# Patient Record
Sex: Male | Born: 1940 | Race: White | Hispanic: No | Marital: Married | Smoking: Former smoker
Health system: Southern US, Community
[De-identification: ages and names within clinical notes are randomized; demographics above are authoritative.]

## PROBLEM LIST (undated history)

## (undated) DIAGNOSIS — J449 Chronic obstructive pulmonary disease, unspecified: Secondary | ICD-10-CM

## (undated) DIAGNOSIS — I10 Essential (primary) hypertension: Secondary | ICD-10-CM

---

## 2011-08-29 ENCOUNTER — Ambulatory Visit: Payer: Medicare Other | Attending: Family Medicine

## 2011-08-29 DIAGNOSIS — G4730 Sleep apnea, unspecified: Secondary | ICD-10-CM

## 2011-08-29 DIAGNOSIS — G4710 Hypersomnia, unspecified: Secondary | ICD-10-CM | POA: Insufficient documentation

## 2011-08-29 DIAGNOSIS — Z6834 Body mass index (BMI) 34.0-34.9, adult: Secondary | ICD-10-CM | POA: Insufficient documentation

## 2011-08-31 NOTE — Procedures (Signed)
[facility] Omir Cooprider A. Gerilyn Pilgrim, MD     [email or web address]          NAMEREISE, GLADNEY                 ACCOUNT NO.:  [id number]  MEDICAL RECORD NO.:  [id number]          PATIENT TYPE:  OUT  LOCATION:  SLEEP LAB                     FACILITY:  [facility]  PHYSICIAN:  Marshawn Ninneman A. Gerilyn Pilgrim, M.D. DATE OF BIRTH:  02/18/1940  DATE OF STUDY:  08/29/2011                           NOCTURNAL POLYSOMNOGRAM  REFERRING PHYSICIAN:  Ernestine Conrad, MD  INDICATION FOR STUDY:  71 year old, who presents with fatigue, hypersomnia, and snoring.  This has been done to evaluate for obstructive sleep apnea syndrome.  EPWORTH SLEEPINESS SCORE:1.   BMI 34.  MEDICATIONS:  Amlodipine.  SLEEP ARCHITECTURE:  Total recording time is 421 minute.  Sleep efficiency is quite low at 27%, sleep latency 43 minutes.  REM latency 188 minutes.  Stage N1 18%, N2 of 60% N3 10%, and REM sleep 12%.  RESPIRATORY DATA:  Baseline oxygen saturation is 93 lowest saturation 86 during REM sleep.  Diagnostic AHI is 42 and RDI 6.  CARDIAC DATA:  Average heart rate is 67 with no significant dysrhythmias observed.  MOVEMENT-PARASOMNIA:  PLM index quite high at 94 minutes.  Additionally, there is periodic limb movements seen during the awake time before the patient went to bed suggestive of restless legs syndrome.  IMPRESSIONS-RECOMMENDATIONS: 1. 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 2. Abnormal architecture with very poor sleep efficiency.  Recommendation: Suggest dopamine agonist such as Requip or Mirapex to treat the limb movement.  Thanks for this referral.     Virjean Boman A. Gerilyn Pilgrim, M.D.    KAD/MEDQ  D:  08/31/2011 09:13:06  T:  08/31/2011 10:00:59  Job:  [id number]

## 2020-04-08 ENCOUNTER — Other Ambulatory Visit: Payer: Self-pay

## 2020-04-08 ENCOUNTER — Inpatient Hospital Stay (HOSPITAL_COMMUNITY)
Admission: EM | Admit: 2020-04-08 | Discharge: 2020-04-10 | DRG: 389 | Disposition: A | Discharge status: Home / Self Care | Payer: No Typology Code available for payment source | Attending: Family Medicine | Admitting: Family Medicine

## 2020-04-08 ENCOUNTER — Encounter (HOSPITAL_COMMUNITY): Payer: Self-pay | Admitting: Emergency Medicine

## 2020-04-08 ENCOUNTER — Emergency Department (HOSPITAL_COMMUNITY): Payer: No Typology Code available for payment source

## 2020-04-08 DIAGNOSIS — R739 Hyperglycemia, unspecified: Secondary | ICD-10-CM | POA: Diagnosis present

## 2020-04-08 DIAGNOSIS — Z20822 Contact with and (suspected) exposure to covid-19: Secondary | ICD-10-CM | POA: Diagnosis present

## 2020-04-08 DIAGNOSIS — Z87891 Personal history of nicotine dependence: Secondary | ICD-10-CM

## 2020-04-08 DIAGNOSIS — Z7982 Long term (current) use of aspirin: Secondary | ICD-10-CM

## 2020-04-08 DIAGNOSIS — J449 Chronic obstructive pulmonary disease, unspecified: Secondary | ICD-10-CM

## 2020-04-08 DIAGNOSIS — K56609 Unspecified intestinal obstruction, unspecified as to partial versus complete obstruction: Principal | ICD-10-CM | POA: Diagnosis present

## 2020-04-08 DIAGNOSIS — E785 Hyperlipidemia, unspecified: Secondary | ICD-10-CM

## 2020-04-08 DIAGNOSIS — R140 Abdominal distension (gaseous): Secondary | ICD-10-CM

## 2020-04-08 DIAGNOSIS — Z79899 Other long term (current) drug therapy: Secondary | ICD-10-CM

## 2020-04-08 DIAGNOSIS — K219 Gastro-esophageal reflux disease without esophagitis: Secondary | ICD-10-CM

## 2020-04-08 DIAGNOSIS — R109 Unspecified abdominal pain: Secondary | ICD-10-CM

## 2020-04-08 DIAGNOSIS — N179 Acute kidney failure, unspecified: Secondary | ICD-10-CM | POA: Diagnosis present

## 2020-04-08 DIAGNOSIS — Z7951 Long term (current) use of inhaled steroids: Secondary | ICD-10-CM

## 2020-04-08 DIAGNOSIS — R112 Nausea with vomiting, unspecified: Secondary | ICD-10-CM

## 2020-04-08 DIAGNOSIS — I10 Essential (primary) hypertension: Secondary | ICD-10-CM | POA: Diagnosis present

## 2020-04-08 DIAGNOSIS — K567 Ileus, unspecified: Secondary | ICD-10-CM | POA: Diagnosis present

## 2020-04-08 DIAGNOSIS — R1084 Generalized abdominal pain: Secondary | ICD-10-CM

## 2020-04-08 DIAGNOSIS — Z6830 Body mass index (BMI) 30.0-30.9, adult: Secondary | ICD-10-CM

## 2020-04-08 DIAGNOSIS — Z888 Allergy status to other drugs, medicaments and biological substances status: Secondary | ICD-10-CM

## 2020-04-08 DIAGNOSIS — E669 Obesity, unspecified: Secondary | ICD-10-CM | POA: Diagnosis present

## 2020-04-08 DIAGNOSIS — E876 Hypokalemia: Secondary | ICD-10-CM | POA: Diagnosis present

## 2020-04-08 HISTORY — DX: Essential (primary) hypertension: I10

## 2020-04-08 HISTORY — DX: Chronic obstructive pulmonary disease, unspecified: J44.9

## 2020-04-08 LAB — COMPREHENSIVE METABOLIC PANEL
ALT: 42 U/L (ref 0–44)
AST: 27 U/L (ref 15–41)
Albumin: 3.9 g/dL (ref 3.5–5.0)
Alkaline Phosphatase: 51 U/L (ref 38–126)
Anion gap: 14 (ref 5–15)
BUN: 26 mg/dL — ABNORMAL HIGH (ref 8–23)
CO2: 26 mmol/L (ref 22–32)
Calcium: 9.4 mg/dL (ref 8.9–10.3)
Chloride: 94 mmol/L — ABNORMAL LOW (ref 98–111)
Creatinine, Ser: 1.58 mg/dL — ABNORMAL HIGH (ref 0.61–1.24)
GFR, Estimated: 45 mL/min — ABNORMAL LOW (ref >60)
Glucose, Bld: 153 mg/dL — ABNORMAL HIGH (ref 70–99)
Potassium: 3.1 mmol/L — ABNORMAL LOW (ref 3.5–5.1)
Sodium: 134 mmol/L — ABNORMAL LOW (ref 135–145)
Total Bilirubin: 1.1 mg/dL (ref 0.3–1.2)
Total Protein: 7.4 g/dL (ref 6.5–8.1)

## 2020-04-08 LAB — POC OCCULT BLOOD, ED: Fecal Occult Bld: NEGATIVE

## 2020-04-08 LAB — CBC
HCT: 47.1 % (ref 39.0–52.0)
Hemoglobin: 16.6 g/dL (ref 13.0–17.0)
MCH: 32 pg (ref 26.0–34.0)
MCHC: 35.2 g/dL (ref 30.0–36.0)
MCV: 90.9 fL (ref 80.0–100.0)
Platelets: 325 10*3/uL (ref 150–400)
RBC: 5.18 MIL/uL (ref 4.22–5.81)
RDW: 12.9 % (ref 11.5–15.5)
WBC: 9.9 10*3/uL (ref 4.0–10.5)
nRBC: 0 % (ref 0.0–0.2)

## 2020-04-08 LAB — LIPASE, BLOOD: Lipase: 21 U/L (ref 11–51)

## 2020-04-08 MED ORDER — ONDANSETRON HCL 4 MG/2ML IJ SOLN
4.0000 mg | Freq: Once | INTRAMUSCULAR | Status: AC
  Administered 2020-04-08: 4 mg via INTRAVENOUS
  Filled 2020-04-08: qty 2

## 2020-04-08 MED ORDER — SODIUM CHLORIDE 0.9 % IV SOLN: INTRAVENOUS | Status: DC

## 2020-04-08 MED ORDER — IOHEXOL 300 MG/ML  SOLN
100.0000 mL | Freq: Once | INTRAMUSCULAR | Status: AC | PRN
  Administered 2020-04-09: 100 mL via INTRAVENOUS

## 2020-04-08 MED ORDER — SODIUM CHLORIDE 0.9 % IV BOLUS
1000.0000 mL | Freq: Once | INTRAVENOUS | Status: AC
  Administered 2020-04-08: 1000 mL via INTRAVENOUS

## 2020-04-08 NOTE — ED Provider Notes (Addendum)
[facility] EMERGENCY DEPARTMENT Provider Note   CSN: [id number] Arrival date & time: 04/08/20  1856     History Chief Complaint  Patient presents with  . Emesis    Melvin [name] is a 80 y.o. male.  Patient with a complaint of nausea vomiting and diarrhea for a week abdominal distention generalized abdominal pain. States that his bowel movements have been black in color. Patient was seen at urgent care and given something for a stomach bug. It has not helped. Temp here 98.1. Tachycardic at 120 respirations 18 blood pressure 120/67 oxygen saturations 92% on room air. Patient is followed at the [facility]. Known to have COPD        Past Medical History:  Diagnosis Date  . COPD (chronic obstructive pulmonary disease) (HCC)   . Hypertension     There are no problems to display for this patient.   History reviewed. No pertinent surgical history.     No family history on file.     Home Medications Prior to Admission medications   Medication Sig Start Date End Date Taking? Authorizing Provider  albuterol (VENTOLIN HFA) 108 (90 Base) MCG/ACT inhaler Inhale 2 puffs into the lungs every 4 (four) hours as needed for wheezing or shortness of breath.   Yes [provider]  AMLODIPINE BESYLATE PO Take 10 mg by mouth daily.   Yes [provider]  aspirin EC 81 MG tablet Take 81 mg by mouth daily. Swallow whole.   Yes [provider]  budesonide-formoterol (SYMBICORT) 160-4.5 MCG/ACT inhaler Inhale 2 puffs into the lungs 2 (two) times daily.   Yes [provider]  cetirizine (ZYRTEC) 10 MG tablet Take 10 mg by mouth daily.   Yes [provider]  hydrochlorothiazide (HYDRODIURIL) 25 MG tablet Take 25 mg by mouth daily.   Yes [provider]  losartan (COZAAR) 100 MG tablet Take 100 mg by mouth daily.   Yes [provider]  Menthol-Methyl Salicylate (THERA-GESIC) 0.5-15 % CREA Apply 1 application topically in the morning  and at bedtime. Avoid face,groin,wash hands after use   Yes [provider]  Multiple Vitamin (MULTIVITAMIN) tablet Take 1 tablet by mouth daily.   Yes [provider]  omeprazole (PRILOSEC) 20 MG capsule Take 20 mg by mouth daily.   Yes [provider]  pramipexole (MIRAPEX) 0.125 MG tablet Take 0.125 mg by mouth at bedtime.   Yes [provider]  simvastatin (ZOCOR) 40 MG tablet Take 40 mg by mouth daily.   Yes [provider]  tiotropium (SPIRIVA) 18 MCG inhalation capsule Place 18 mcg into inhaler and inhale daily.   Yes [provider]  hyoscyamine (LEVSIN) 0.125 MG tablet Take by mouth every 6 (six) hours as needed. Patient not taking: No sig reported 04/07/20   [provider]    Allergies    Atorvastatin and Lisinopril  Review of Systems   Review of Systems  Constitutional: Negative for chills and fever.  HENT: Negative for congestion, rhinorrhea and sore throat.   Eyes: Negative for visual disturbance.  Respiratory: Negative for cough and shortness of breath.   Cardiovascular: Negative for chest pain and leg swelling.  Gastrointestinal: Positive for abdominal distention and abdominal pain. Negative for diarrhea, nausea and vomiting.  Genitourinary: Negative for dysuria.  Musculoskeletal: Negative for back pain and neck pain.  Skin: Negative for rash.  Neurological: Negative for dizziness, light-headedness and headaches.  Hematological: Does not bruise/bleed easily.  Psychiatric/Behavioral: Negative for confusion.  Physical Exam Updated Vital Signs BP (!) 165/81   Pulse 100   Temp 98.1 F (36.7 C)   Resp 18   Ht 1.753 m (5\' 9" )   Wt 104.3 kg   SpO2 100%   BMI 33.97 kg/m   Physical Exam Vitals and nursing note reviewed.  Constitutional:      General: He is not in acute distress.    Appearance: He is well-developed and well-nourished. He is ill-appearing.  HENT:     Head: Normocephalic and  atraumatic.  Eyes:     Conjunctiva/sclera: Conjunctivae normal.  Cardiovascular:     Rate and Rhythm: Normal rate and regular rhythm.     Heart sounds: No murmur heard.   Pulmonary:     Effort: Pulmonary effort is normal. No respiratory distress.     Breath sounds: Normal breath sounds. No wheezing.  Abdominal:     General: There is distension.     Palpations: Abdomen is soft.     Tenderness: There is abdominal tenderness.     Comments: Abdomen is distended. Some mild diffuse tenderness.  Genitourinary:    Rectum: Guaiac result negative.     Comments: Stool a light brown in color. Hemoccult negative Musculoskeletal:        General: No swelling or edema.     Cervical back: Neck supple.  Skin:    General: Skin is warm and dry.  Neurological:     General: No focal deficit present.     Mental Status: He is alert and oriented to person, place, and time.     Cranial Nerves: No cranial nerve deficit.     Sensory: No sensory deficit.  Psychiatric:        Mood and Affect: Mood and affect normal.     ED Results / Procedures / Treatments   Labs (all labs ordered are listed, but only abnormal results are displayed) Labs Reviewed  COMPREHENSIVE METABOLIC PANEL - Abnormal; Notable for the following components:      Result Value   Sodium 134 (*)    Potassium 3.1 (*)    Chloride 94 (*)    Glucose, Bld 153 (*)    BUN 26 (*)    Creatinine, Ser 1.58 (*)    GFR, Estimated 45 (*)    All other components within normal limits  LIPASE, BLOOD  CBC  URINALYSIS, ROUTINE W REFLEX MICROSCOPIC  POC OCCULT BLOOD, ED    EKG None  Radiology No results found.  Procedures Procedures   CRITICAL CARE Performed by: Total critical care time: 35 minutes Critical care time was exclusive of separately billable procedures and treating other patients. Critical care was necessary to treat or prevent imminent or life-threatening deterioration. Critical care was time spent  personally by me on the following activities: development of treatment plan with patient and/or surrogate as well as nursing, discussions with consultants, evaluation of patient's response to treatment, examination of patient, obtaining history from patient or surrogate, ordering and performing treatments and interventions, ordering and review of laboratory studies, ordering and review of radiographic studies, pulse oximetry and re-evaluation of patient's condition.   Medications Ordered in ED Medications  0.9 %  sodium chloride infusion (has no administration in time range)  iohexol (OMNIPAQUE) 300 MG/ML solution 100 mL (has no administration in time range)  sodium chloride 0.9 % bolus 1,000 mL (1,000 mLs Intravenous New Bag/Given 04/08/20 2223)  ondansetron (ZOFRAN) injection 4 mg (4 mg Intravenous Given 04/08/20 2223)    ED  Course  I have reviewed the triage vital signs and the nursing notes.  Pertinent labs & imaging results that were available during my care of the patient were reviewed by me and considered in my medical decision making (see chart for details).    MDM Rules/Calculators/A&P                         Patient with significant abdominal distention. Some generalized tenderness without guarding. Also about a 1 week history of nausea vomiting and diarrhea. Patient already had melanotic stool. But no evidence of that here today Hemoccult negative and light brown in color. Patient will get CT scan of abdomen and pelvis. Patient's oxygen saturation when he came in was 92%. But he will drop down into the upper 80s. Started on 2 L nasal cannula. We will go ahead and add on chest x-ray. Patient may very well require admission for the hypoxia. We do not have much of a past medical history on him he is followed at the [place] but he is known to have COPD. No wheezing here.  In addition although it would be unusual. With all the nausea vomiting and diarrhea gas COVID is a possibility. But had not  order Covid testing until we get CT scan and chest x-ray results back.  Final Clinical Impression(s) / ED Diagnoses Final diagnoses:  Nausea vomiting and diarrhea  Generalized abdominal pain    Rx / DC Orders ED Discharge Orders    None       Vanetta Mulders, MD 04/09/20 0000    Vanetta Mulders, MD 04/09/20 0025

## 2020-04-08 NOTE — ED Triage Notes (Signed)
Pt c/o N/V/D x 1 week. Pt was seen at [facility] who diagnosed him with a stomach bug. Pt states medications he was given have not helped.

## 2020-04-08 NOTE — ED Notes (Signed)
Pt 87% on room air. Placed on 2l [place]

## 2020-04-09 ENCOUNTER — Inpatient Hospital Stay (HOSPITAL_COMMUNITY): Payer: No Typology Code available for payment source

## 2020-04-09 ENCOUNTER — Emergency Department (HOSPITAL_COMMUNITY): Payer: No Typology Code available for payment source

## 2020-04-09 ENCOUNTER — Encounter (HOSPITAL_COMMUNITY): Payer: Self-pay | Admitting: Internal Medicine

## 2020-04-09 DIAGNOSIS — E669 Obesity, unspecified: Secondary | ICD-10-CM

## 2020-04-09 DIAGNOSIS — Z20822 Contact with and (suspected) exposure to covid-19: Secondary | ICD-10-CM | POA: Diagnosis present

## 2020-04-09 DIAGNOSIS — R140 Abdominal distension (gaseous): Secondary | ICD-10-CM

## 2020-04-09 DIAGNOSIS — E876 Hypokalemia: Secondary | ICD-10-CM

## 2020-04-09 DIAGNOSIS — K56609 Unspecified intestinal obstruction, unspecified as to partial versus complete obstruction: Secondary | ICD-10-CM | POA: Diagnosis present

## 2020-04-09 DIAGNOSIS — E785 Hyperlipidemia, unspecified: Secondary | ICD-10-CM

## 2020-04-09 DIAGNOSIS — Z79899 Other long term (current) drug therapy: Secondary | ICD-10-CM | POA: Diagnosis not present

## 2020-04-09 DIAGNOSIS — R112 Nausea with vomiting, unspecified: Secondary | ICD-10-CM | POA: Diagnosis not present

## 2020-04-09 DIAGNOSIS — Z6830 Body mass index (BMI) 30.0-30.9, adult: Secondary | ICD-10-CM | POA: Diagnosis not present

## 2020-04-09 DIAGNOSIS — R197 Diarrhea, unspecified: Secondary | ICD-10-CM | POA: Diagnosis not present

## 2020-04-09 DIAGNOSIS — E782 Mixed hyperlipidemia: Secondary | ICD-10-CM

## 2020-04-09 DIAGNOSIS — J449 Chronic obstructive pulmonary disease, unspecified: Secondary | ICD-10-CM | POA: Diagnosis present

## 2020-04-09 DIAGNOSIS — Z7951 Long term (current) use of inhaled steroids: Secondary | ICD-10-CM | POA: Diagnosis not present

## 2020-04-09 DIAGNOSIS — K219 Gastro-esophageal reflux disease without esophagitis: Secondary | ICD-10-CM

## 2020-04-09 DIAGNOSIS — R1084 Generalized abdominal pain: Secondary | ICD-10-CM

## 2020-04-09 DIAGNOSIS — R739 Hyperglycemia, unspecified: Secondary | ICD-10-CM | POA: Diagnosis present

## 2020-04-09 DIAGNOSIS — Z87891 Personal history of nicotine dependence: Secondary | ICD-10-CM | POA: Diagnosis not present

## 2020-04-09 DIAGNOSIS — N179 Acute kidney failure, unspecified: Secondary | ICD-10-CM

## 2020-04-09 DIAGNOSIS — I10 Essential (primary) hypertension: Secondary | ICD-10-CM

## 2020-04-09 DIAGNOSIS — Z7982 Long term (current) use of aspirin: Secondary | ICD-10-CM | POA: Diagnosis not present

## 2020-04-09 DIAGNOSIS — R109 Unspecified abdominal pain: Secondary | ICD-10-CM

## 2020-04-09 DIAGNOSIS — Z888 Allergy status to other drugs, medicaments and biological substances status: Secondary | ICD-10-CM | POA: Diagnosis not present

## 2020-04-09 DIAGNOSIS — K567 Ileus, unspecified: Secondary | ICD-10-CM | POA: Diagnosis present

## 2020-04-09 LAB — URINALYSIS, ROUTINE W REFLEX MICROSCOPIC
Bacteria, UA: NONE SEEN
Bilirubin Urine: NEGATIVE
Glucose, UA: NEGATIVE mg/dL
Hgb urine dipstick: NEGATIVE
Ketones, ur: 5 mg/dL — AB
Leukocytes,Ua: NEGATIVE
Nitrite: NEGATIVE
Protein, ur: 30 mg/dL — AB
Specific Gravity, Urine: 1.021 (ref 1.005–1.030)
pH: 5 (ref 5.0–8.0)

## 2020-04-09 LAB — CBC
HCT: 42.9 % (ref 39.0–52.0)
Hemoglobin: 14.9 g/dL (ref 13.0–17.0)
MCH: 32.3 pg (ref 26.0–34.0)
MCHC: 34.7 g/dL (ref 30.0–36.0)
MCV: 92.9 fL (ref 80.0–100.0)
Platelets: 258 10*3/uL (ref 150–400)
RBC: 4.62 MIL/uL (ref 4.22–5.81)
RDW: 13 % (ref 11.5–15.5)
WBC: 7.7 10*3/uL (ref 4.0–10.5)
nRBC: 0 % (ref 0.0–0.2)

## 2020-04-09 LAB — RESP PANEL BY RT-PCR (FLU A&B, COVID) ARPGX2
Influenza A by PCR: NEGATIVE
Influenza B by PCR: NEGATIVE
SARS Coronavirus 2 by RT PCR: NEGATIVE

## 2020-04-09 LAB — COMPREHENSIVE METABOLIC PANEL
ALT: 35 U/L (ref 0–44)
AST: 21 U/L (ref 15–41)
Albumin: 3.4 g/dL — ABNORMAL LOW (ref 3.5–5.0)
Alkaline Phosphatase: 43 U/L (ref 38–126)
Anion gap: 12 (ref 5–15)
BUN: 26 mg/dL — ABNORMAL HIGH (ref 8–23)
CO2: 28 mmol/L (ref 22–32)
Calcium: 8.5 mg/dL — ABNORMAL LOW (ref 8.9–10.3)
Chloride: 96 mmol/L — ABNORMAL LOW (ref 98–111)
Creatinine, Ser: 1.04 mg/dL (ref 0.61–1.24)
GFR, Estimated: >60 mL/min (ref >60)
Glucose, Bld: 122 mg/dL — ABNORMAL HIGH (ref 70–99)
Potassium: 3.3 mmol/L — ABNORMAL LOW (ref 3.5–5.1)
Sodium: 136 mmol/L (ref 135–145)
Total Bilirubin: 0.8 mg/dL (ref 0.3–1.2)
Total Protein: 6.3 g/dL — ABNORMAL LOW (ref 6.5–8.1)

## 2020-04-09 LAB — PROTIME-INR
INR: 1.2 (ref 0.8–1.2)
Prothrombin Time: 14.7 seconds (ref 11.4–15.2)

## 2020-04-09 LAB — PHOSPHORUS: Phosphorus: 3.6 mg/dL (ref 2.5–4.6)

## 2020-04-09 LAB — APTT: aPTT: 31 seconds (ref 24–36)

## 2020-04-09 LAB — MAGNESIUM: Magnesium: 1.8 mg/dL (ref 1.7–2.4)

## 2020-04-09 MED ORDER — ENOXAPARIN SODIUM 60 MG/0.6ML ~~LOC~~ SOLN
50.0000 mg | SUBCUTANEOUS | Status: DC
  Administered 2020-04-09 – 2020-04-10 (×2): 50 mg via SUBCUTANEOUS
  Filled 2020-04-09 (×2): qty 0.6

## 2020-04-09 MED ORDER — SODIUM CHLORIDE 0.9 % IV SOLN: INTRAVENOUS | Status: DC

## 2020-04-09 MED ORDER — PHENOL 1.4 % MT LIQD
1.0000 | OROMUCOSAL | Status: DC | PRN
  Administered 2020-04-09: 1 via OROMUCOSAL
  Filled 2020-04-09: qty 177

## 2020-04-09 MED ORDER — GABAPENTIN 300 MG PO CAPS
600.0000 mg | ORAL_CAPSULE | Freq: Once | ORAL | Status: AC
  Administered 2020-04-09: 600 mg via ORAL
  Filled 2020-04-09: qty 2

## 2020-04-09 MED ORDER — ONDANSETRON HCL 4 MG/2ML IJ SOLN
4.0000 mg | Freq: Once | INTRAMUSCULAR | Status: AC
  Administered 2020-04-09: 4 mg via INTRAVENOUS
  Filled 2020-04-09: qty 2

## 2020-04-09 MED ORDER — PANTOPRAZOLE SODIUM 40 MG IV SOLR
40.0000 mg | INTRAVENOUS | Status: DC
  Administered 2020-04-09 – 2020-04-10 (×2): 40 mg via INTRAVENOUS
  Filled 2020-04-09 (×2): qty 40

## 2020-04-09 MED ORDER — MOMETASONE FURO-FORMOTEROL FUM 200-5 MCG/ACT IN AERO
2.0000 | INHALATION_SPRAY | Freq: Two times a day (BID) | RESPIRATORY_TRACT | Status: DC
  Administered 2020-04-09 – 2020-04-10 (×3): 2 via RESPIRATORY_TRACT
  Filled 2020-04-09: qty 8.8

## 2020-04-09 MED ORDER — MORPHINE SULFATE (PF) 2 MG/ML IV SOLN: 2.0000 mg | INTRAVENOUS | Status: DC | PRN

## 2020-04-09 MED ORDER — POTASSIUM CHLORIDE IN NACL 20-0.9 MEQ/L-% IV SOLN: INTRAVENOUS | Status: DC

## 2020-04-09 MED ORDER — DOCUSATE SODIUM 50 MG/5ML PO LIQD
50.0000 mg | Freq: Every day | ORAL | Status: DC
  Administered 2020-04-09: 50 mg
  Filled 2020-04-09 (×3): qty 10

## 2020-04-09 MED ORDER — UMECLIDINIUM BROMIDE 62.5 MCG/INH IN AEPB
1.0000 | INHALATION_SPRAY | Freq: Every day | RESPIRATORY_TRACT | Status: DC
  Administered 2020-04-09 – 2020-04-10 (×2): 1 via RESPIRATORY_TRACT
  Filled 2020-04-09: qty 7

## 2020-04-09 MED ORDER — DOCUSATE SODIUM 50 MG/5ML PO LIQD
50.0000 mg | Freq: Every day | ORAL | Status: DC
  Filled 2020-04-09 (×3): qty 10

## 2020-04-09 MED ORDER — FENTANYL CITRATE (PF) 100 MCG/2ML IJ SOLN
50.0000 ug | Freq: Once | INTRAMUSCULAR | Status: AC
  Administered 2020-04-09: 50 ug via INTRAVENOUS
  Filled 2020-04-09: qty 2

## 2020-04-09 MED ORDER — LABETALOL HCL 5 MG/ML IV SOLN: 5.0000 mg | Freq: Four times a day (QID) | INTRAVENOUS | Status: DC | PRN

## 2020-04-09 MED ORDER — TIOTROPIUM BROMIDE MONOHYDRATE 18 MCG IN CAPS
18.0000 ug | ORAL_CAPSULE | Freq: Every day | RESPIRATORY_TRACT | Status: DC
  Filled 2020-04-09: qty 5

## 2020-04-09 MED ORDER — POTASSIUM CHLORIDE 10 MEQ/100ML IV SOLN
10.0000 meq | INTRAVENOUS | Status: AC
  Administered 2020-04-09 (×2): 10 meq via INTRAVENOUS
  Filled 2020-04-09: qty 100

## 2020-04-09 MED ORDER — ALBUTEROL SULFATE HFA 108 (90 BASE) MCG/ACT IN AERS: 2.0000 | INHALATION_SPRAY | RESPIRATORY_TRACT | Status: DC | PRN

## 2020-04-09 MED ORDER — ONDANSETRON HCL 4 MG/2ML IJ SOLN: 4.0000 mg | Freq: Four times a day (QID) | INTRAMUSCULAR | Status: DC | PRN

## 2020-04-09 NOTE — ED Provider Notes (Signed)
Care assumed from Dr. Deretha Emory.  Patient with abdominal distention, abdominal pain, nausea and vomiting of black stools.  Hemoccult is negative.  CT scan is concerning for small bowel obstruction.  Patient with no previous abdominal surgeries.  Will hydrate, place NG tube, plan admission. CXR is negative. Lungs clear. Abdomen distended, soft. No guarding or rebound.  Case discussed with Dr. Thomes Dinning.  Surgical consult in the morning. Message sent to Dr. Henreitta Leber.   Glynn Octave, MD 04/09/20 [phone]

## 2020-04-09 NOTE — Consult Note (Signed)
[facility] Consult  Reason for Consult: ? SBO  Referring Physician:  Dr. Bronson Ing  Chief Complaint    Emesis      HPI: Melvin [name] is a 80 y.o. male with HTN, COPD who has never had surgery and comes in with 1 week history of abdominal pain, bloating, nausea, and diarrhea. His diarrhea was so bad last week that he started to take imodium on Thursday. He then became constipated. He has been eating and drinking but he says that he would get bloated. He has been passing flatus today and had a BM this Am. He says that he is feeling better. He has never had a bowel obstruction before. He has had colonoscopies in the past with polyps.    He said he went to urgent care an was told he had a GI but before his symptoms worsened with the imodium, and then came to the ED.   Past Medical History:  Diagnosis Date  . COPD (chronic obstructive pulmonary disease) (HCC)   . Hypertension     History reviewed. No pertinent surgical history.  History reviewed. No pertinent family history.  Social History   Tobacco Use  . Smoking status: Former Games developer  . Smokeless tobacco: Never Used  Vaping Use  . Vaping Use: Never used    Medications:  I have reviewed the patient's current medications. Prior to Admission:  Medications Prior to Admission  Medication Sig Dispense Refill Last Dose  . albuterol (VENTOLIN HFA) 108 (90 Base) MCG/ACT inhaler Inhale 2 puffs into the lungs every 4 (four) hours as needed for wheezing or shortness of breath.   04/07/2020 at Unknown time  . AMLODIPINE BESYLATE PO Take 10 mg by mouth daily.   04/08/2020 at Unknown time  . aspirin EC 81 MG tablet Take 81 mg by mouth daily. Swallow whole.   04/08/2020 at Unknown time  . budesonide-formoterol (SYMBICORT) 160-4.5 MCG/ACT inhaler Inhale 2 puffs into the lungs 2 (two) times daily.   04/08/2020 at Unknown time  . cetirizine (ZYRTEC) 10 MG tablet Take 10 mg by mouth daily.   04/08/2020 at Unknown time  .  hydrochlorothiazide (HYDRODIURIL) 25 MG tablet Take 25 mg by mouth daily.   04/08/2020 at Unknown time  . losartan (COZAAR) 100 MG tablet Take 100 mg by mouth daily.   04/08/2020 at Unknown time  . Menthol-Methyl Salicylate (THERA-GESIC) 0.5-15 % CREA Apply 1 application topically in the morning and at bedtime. Avoid face,groin,wash hands after use   04/07/2020  . Multiple Vitamin (MULTIVITAMIN) tablet Take 1 tablet by mouth daily.   04/08/2020 at Unknown time  . omeprazole (PRILOSEC) 20 MG capsule Take 20 mg by mouth daily.   04/08/2020 at Unknown time  . pramipexole (MIRAPEX) 0.125 MG tablet Take 0.125 mg by mouth at bedtime.   04/07/2020 at Unknown time  . simvastatin (ZOCOR) 40 MG tablet Take 40 mg by mouth daily.   04/07/2020 at Unknown time  . tiotropium (SPIRIVA) 18 MCG inhalation capsule Place 18 mcg into inhaler and inhale daily.   04/08/2020 at Unknown time  . hyoscyamine (LEVSIN) 0.125 MG tablet Take by mouth every 6 (six) hours as needed. (Patient not taking: No sig reported)   Not Taking at Unknown time   Scheduled: . docusate  50 mg Per Tube Daily  . enoxaparin (LOVENOX) injection  50 mg Subcutaneous Q24H  . mometasone-formoterol  2 puff Inhalation BID  . pantoprazole (PROTONIX) IV  40 mg Intravenous Q24H  . umeclidinium bromide  1 puff Inhalation Daily   Continuous: . 0.9 % NaCl with KCl 20 mEq / L 100 mL/hr at 04/09/20 0851   FOY:DXAJOINOM, labetalol, morphine injection, ondansetron (ZOFRAN) IV, phenol  Allergies  Allergen Reactions  . Atorvastatin   . Lisinopril Cough     ROS:  A comprehensive review of systems was negative except for: Gastrointestinal: positive for abdominal pain, diarrhea and bloating  Blood pressure 134/69, pulse 85, temperature 98.4 F (36.9 C), resp. rate 20, height 5\' 9"  (1.753 m), weight 104.3 kg, SpO2 94 %. Physical Exam Vitals reviewed.  HENT:     Head: Normocephalic.  Eyes:     Extraocular Movements: Extraocular movements intact.  Cardiovascular:      Rate and Rhythm: Normal rate and regular rhythm.  Pulmonary:     Effort: Pulmonary effort is normal.  Abdominal:     General: There is distension.     Palpations: Abdomen is soft.     Tenderness: There is no abdominal tenderness.  Musculoskeletal:        General: Normal range of motion.     Cervical back: No rigidity.  Skin:    General: Skin is warm.  Neurological:     General: No focal deficit present.     Mental Status: He is alert and oriented to person, place, and time.  Psychiatric:        Mood and Affect: Mood normal.        Behavior: Behavior normal.        Thought Content: Thought content normal.        Judgment: Judgment normal.     Results: Results for orders placed or performed during the hospital encounter of 04/08/20 (from the past 48 hour(s))  Lipase, blood     Status: None   Collection Time: 04/08/20  8:03 PM  Result Value Ref Range   Lipase 21 11 - 51 U/L    Comment: Performed at [facility], [address], [place], [place]  Comprehensive metabolic panel     Status: Abnormal   Collection Time: 04/08/20  8:03 PM  Result Value Ref Range   Sodium 134 (L) 135 - 145 mmol/L   Potassium 3.1 (L) 3.5 - 5.1 mmol/L   Chloride 94 (L) 98 - 111 mmol/L   CO2 26 22 - 32 mmol/L   Glucose, Bld 153 (H) 70 - 99 mg/dL    Comment: Glucose reference range applies only to samples taken after fasting for at least 8 hours.   BUN 26 (H) 8 - 23 mg/dL   Creatinine, Ser 06/08/20 (H) 0.61 - 1.24 mg/dL   Calcium 9.4 8.9 - 9.47 mg/dL   Total Protein 7.4 6.5 - 8.1 g/dL   Albumin 3.9 3.5 - 5.0 g/dL   AST 27 15 - 41 U/L   ALT 42 0 - 44 U/L   Alkaline Phosphatase 51 38 - 126 U/L   Total Bilirubin 1.1 0.3 - 1.2 mg/dL   GFR, Estimated 45 (L) >60 mL/min    Comment: (NOTE) Calculated using the CKD-EPI Creatinine Equation (2021)    Anion gap 14 5 - 15    Comment: Performed at [facility], [address], [place], [place]  CBC     Status: None   Collection Time:  04/08/20  8:03 PM  Result Value Ref Range   WBC 9.9 4.0 - 10.5 K/uL   RBC 5.18 4.22 - 5.81 MIL/uL   Hemoglobin 16.6 13.0 - 17.0 g/dL   HCT 47.1  39.0 - 52.0 %   MCV 90.9 80.0 - 100.0 fL   MCH 32.0 26.0 - 34.0 pg   MCHC 35.2 30.0 - 36.0 g/dL   RDW 76.8 11.5 - 72.6 %   Platelets 325 150 - 400 K/uL   nRBC 0.0 0.0 - 0.2 %    Comment: Performed at [facility], [address], [place], [place] [address]  Urinalysis, Routine w reflex microscopic     Status: Abnormal   Collection Time: 04/08/20 11:32 PM  Result Value Ref Range   Color, Urine AMBER (A) YELLOW    Comment: BIOCHEMICALS MAY BE AFFECTED BY COLOR   APPearance CLOUDY (A) CLEAR   Specific Gravity, Urine 1.021 1.005 - 1.030   pH 5.0 5.0 - 8.0   Glucose, UA NEGATIVE NEGATIVE mg/dL   Hgb urine dipstick NEGATIVE NEGATIVE   Bilirubin Urine NEGATIVE NEGATIVE   Ketones, ur 5 (A) NEGATIVE mg/dL   Protein, ur 30 (A) NEGATIVE mg/dL   Nitrite NEGATIVE NEGATIVE   Leukocytes,Ua NEGATIVE NEGATIVE   RBC / HPF 0-5 0 - 5 RBC/hpf   WBC, UA 6-10 0 - 5 WBC/hpf   Bacteria, UA NONE SEEN NONE SEEN   Squamous Epithelial / LPF 0-5 0 - 5   Mucus PRESENT    Hyaline Casts, UA PRESENT     Comment: Performed at [facility], [address], [place], [place] [address]  POC occult blood, ED Provider will collect     Status: None   Collection Time: 04/08/20 11:33 PM  Result Value Ref Range   Fecal Occult Bld NEGATIVE NEGATIVE  Resp Panel by RT-PCR (Flu A&B, Covid) Nasopharyngeal Swab     Status: None   Collection Time: 04/09/20  1:52 AM   Specimen: Nasopharyngeal Swab; Nasopharyngeal(NP) swabs in vial transport medium  Result Value Ref Range   SARS Coronavirus 2 by RT PCR NEGATIVE NEGATIVE    Comment: (NOTE) SARS-CoV-2 target nucleic acids are NOT DETECTED.  The SARS-CoV-2 RNA is generally detectable in upper respiratory specimens during the acute phase of infection. The lowest concentration of SARS-CoV-2 viral copies this assay can detect is 138  copies/mL. A negative result does not preclude SARS-Cov-2 infection and should not be used as the sole basis for treatment or other patient management decisions. A negative result may occur with  improper specimen collection/handling, submission of specimen other than nasopharyngeal swab, presence of viral mutation(s) within the areas targeted by this assay, and inadequate number of viral copies(<138 copies/mL). A negative result must be combined with clinical observations, patient history, and epidemiological information. The expected result is Negative.  Fact Sheet for Patients:  BloggerCourse.com  Fact Sheet for Healthcare Providers:  SeriousBroker.it  This test is no t yet approved or cleared by the [place] FDA and  has been authorized for detection and/or diagnosis of SARS-CoV-2 by FDA under an Emergency Use Authorization (EUA). This EUA will remain  in effect (meaning this test can be used) for the duration of the COVID-19 declaration under Section 564(b)(1) of the Act, 21 U.S.C.section 360bbb-3(b)(1), unless the authorization is terminated  or revoked sooner.       Influenza A by PCR NEGATIVE NEGATIVE   Influenza B by PCR NEGATIVE NEGATIVE    Comment: (NOTE) The Xpert Xpress SARS-CoV-2/FLU/RSV plus assay is intended as an aid in the diagnosis of influenza from Nasopharyngeal swab specimens and should not be used as a sole basis for treatment. Nasal washings and aspirates are unacceptable for Xpert Xpress SARS-CoV-2/FLU/RSV testing.  Fact Sheet for Patients: BloggerCourse.comhttps://www.fda.gov/media/152166/download  Fact Sheet for Healthcare Providers: SeriousBroker.ithttps://www.fda.gov/media/152162/download  This test is not yet approved or cleared by the [place] FDA and has been authorized for detection and/or diagnosis of SARS-CoV-2 by FDA under an Emergency Use Authorization (EUA). This EUA will remain in effect (meaning this test  can be used) for the duration of the COVID-19 declaration under Section 564(b)(1) of the Act, 21 U.S.C. section 360bbb-3(b)(1), unless the authorization is terminated or revoked.  Performed at [facility], [address] [place], [place] [address]   Comprehensive metabolic panel     Status: Abnormal   Collection Time: 04/09/20  5:59 AM  Result Value Ref Range   Sodium 136 135 - 145 mmol/L   Potassium 3.3 (L) 3.5 - 5.1 mmol/L   Chloride 96 (L) 98 - 111 mmol/L   CO2 28 22 - 32 mmol/L   Glucose, Bld 122 (H) 70 - 99 mg/dL    Comment: Glucose reference range applies only to samples taken after fasting for at least 8 hours.   BUN 26 (H) 8 - 23 mg/dL   Creatinine, Ser 0.981.04 0.61 - 1.24 mg/dL   Calcium 8.5 (L) 8.9 - 10.3 mg/dL   Total Protein 6.3 (L) 6.5 - 8.1 g/dL   Albumin 3.4 (L) 3.5 - 5.0 g/dL   AST 21 15 - 41 U/L   ALT 35 0 - 44 U/L   Alkaline Phosphatase 43 38 - 126 U/L   Total Bilirubin 0.8 0.3 - 1.2 mg/dL   GFR, Estimated >11>60 >91>60 mL/min    Comment: (NOTE) Calculated using the CKD-EPI Creatinine Equation (2021)    Anion gap 12 5 - 15    Comment: Performed at [facility], [address] [place], [place] [address]  CBC     Status: None   Collection Time: 04/09/20  5:59 AM  Result Value Ref Range   WBC 7.7 4.0 - 10.5 K/uL   RBC 4.62 4.22 - 5.81 MIL/uL   Hemoglobin 14.9 13.0 - 17.0 g/dL   HCT 56.242.9 13.039.0 - 86.552.0 %   MCV 92.9 80.0 - 100.0 fL   MCH 32.3 26.0 - 34.0 pg   MCHC 34.7 30.0 - 36.0 g/dL   RDW 78.413.0 69.611.5 - 29.515.5 %   Platelets 258 150 - 400 K/uL   nRBC 0.0 0.0 - 0.2 %    Comment: Performed at [facility], [address] [place], [place] [address]  Protime-INR     Status: None   Collection Time: 04/09/20  5:59 AM  Result Value Ref Range   Prothrombin Time 14.7 11.4 - 15.2 seconds   INR 1.2 0.8 - 1.2    Comment: (NOTE) INR goal varies based on device and disease states. Performed at [facility], [address] [place], [place] [address]   APTT     Status: None    Collection Time: 04/09/20  5:59 AM  Result Value Ref Range   aPTT 31 24 - 36 seconds    Comment: Performed at [facility], [address] [place], [place] [address]  Magnesium     Status: None   Collection Time: 04/09/20  5:59 AM  Result Value Ref Range   Magnesium 1.8 1.7 - 2.4 mg/dL    Comment: Performed at [facility], [address] [place], [place] [address]  Phosphorus     Status: None   Collection Time: 04/09/20  5:59 AM  Result Value Ref Range   Phosphorus 3.6 2.5 - 4.6 mg/dL  Comment: Performed at [facility] [address], [place], [place] [address]   Reviewed CT personally and with Dr. Tyron Russell- dilated small bowel and thickened versus decompressed ileum distally, but no signs of an overt transition CT Abdomen Pelvis W Contrast  Result Date: 04/09/2020 CLINICAL DATA:  Abdominal distension and pain EXAM: CT ABDOMEN AND PELVIS WITH CONTRAST TECHNIQUE: Multidetector CT imaging of the abdomen and pelvis was performed using the standard protocol following bolus administration of intravenous contrast. CONTRAST:  OMNIPAQUE IOHEXOL 300 MG/ML  SOLN COMPARISON:  None. FINDINGS: Lower chest: The visualized heart size within normal limits. No pericardial fluid/thickening. No hiatal hernia. The visualized portions of the lungs are clear. Hepatobiliary: The liver is normal in density without focal abnormality.The main portal vein is patent. Layering calcified gallstone is present. Pancreas: Fatty atrophy seen throughout the pancreas. No pancreatic ductal dilatation or surrounding inflammatory changes. Spleen: Normal in size without focal abnormality. Adrenals/Urinary Tract: Both adrenal glands appear normal. The kidneys and collecting system appear normal without evidence of urinary tract calculus or hydronephrosis. Bladder is unremarkable. Stomach/Bowel: The stomach and proximal small bowel is unremarkable. There is dilated loops of distal jejunum and ileum measuring up to 4.3 cm with air and  fluid down to the level of the distal ileum. Within the right lower quadrant there is a focal area of wall thickening and narrowing within the distal ileal loops, series 5, image 56. The remainder of the distal ileum and terminal ileum are decompressed. There is scattered colonic diverticula without diverticulitis. The appendix is unremarkable. Vascular/Lymphatic: There are no enlarged mesenteric, retroperitoneal, or pelvic lymph nodes. Scattered aortic atherosclerotic calcifications are seen without aneurysmal dilatation. Reproductive: The prostate is unremarkable. Other: Fat containing inguinal hernias are noted. Musculoskeletal: No acute or significant osseous findings. Degenerative changes seen throughout the visualized thoracolumbar spine. IMPRESSION: Findings consistent with a small-bowel obstruction to the distal ileum where there is a focal area of narrowing in the right lower quadrant. The terminal ileum appears to be decompressed. No free air or loculated fluid collections. Diverticulosis without diverticulitis Cholelithiasis Aortic Atherosclerosis (ICD10-I70.0). Electronically Signed   By: Jonna Clark M.D.   On: 04/09/2020 00:31   DG Chest Portable 1 View  Result Date: 04/09/2020 CLINICAL DATA:  NG tube placement EXAM: PORTABLE CHEST 1 VIEW COMPARISON:  April 09, 2020 FINDINGS: The enteric tube tip projects over the expected region of the gastric body. Dilated loops of bowel are noted in the upper abdomen. There are old healed left-sided rib fractures. IMPRESSION: NG tube projects over the gastric body. Electronically Signed   By: Katherine Mantle M.D.   On: 04/09/2020 02:39   DG Chest Port 1 View  Result Date: 04/09/2020 CLINICAL DATA:  Hypoxia EXAM: PORTABLE CHEST 1 VIEW COMPARISON:  None. FINDINGS: The heart size and mediastinal contours are within normal limits. Aortic knob calcifications are seen. Both lungs are clear. The visualized skeletal structures are unremarkable. IMPRESSION: No active  disease. Electronically Signed   By: Jonna Clark M.D.   On: 04/09/2020 00:40     Assessment & Plan:  Irineo Gaulin is a 80 y.o. male with what is likely a gastroenteritis that was made worse after taking imodium and becoming constipated. He is not clinically obstructed but does have signs of some degree of obstruction on CT. There is no signs of a transition but more of a gradual decrease in caliber and possible thickened ileum versus just a decompressed segment.  He has no history of IBD.    -  Discussed that he is feeling better and not obstructed clinically, NG removed and sips/ chips today - Ambulate - Given the question of the thickening of the ileum at some point possibly as an outpatient it will be a good idea to do CT enterography to ensure we are not missing a mass or something concerning and to demonstrate that this area of thickening has resolved   Updated Dr. Flossie Dibble and RN.   All questions were answered to the satisfaction of the patient.    Lucretia Roers 04/09/2020, 9:19 AM

## 2020-04-09 NOTE — TOC Progression Note (Signed)
[facility] notification completed. Reference number is [id number] [phone]

## 2020-04-09 NOTE — H&P (Signed)
History and Physical  Melvin [name] CLE:[id number] DOB: 1940-12-30 DOA: 04/08/2020  Referring physician: Glynn Octave, MD PCP: Pcp, No  Patient coming from: Home  Chief Complaint: Abdominal pain and vomiting   HPI: Melvin [name] is a 80 y.o. male with medical history significant for hypertension, COPD who presents to the emergency department due to 1 week onset of nausea, vomiting, diarrhea, abdominal pain and distention.  Patient complained of about 2 episodes of nonbloody, nonbilious vomiting daily, he has not been able to tolerate oral intake due to vomiting.  He complained of increased abdominal distention, diarrhea was noted to be black in nature, Imodium and Pepto-Bismol controlled the diarrhea, patient did not complain of constipation, so he tried over the counter medication without improvement.  Abdominal pain was generalized and intermittent in nature with no alleviating/aggravating factor.  He went to an urgent care and was asked to stop taking the laxatives, patient states that he was prescribed medication for stomach bug which did not help, so he presented to the ED for further evaluation and management.  Patient denies fever, chills, chest pain, headache and palpitations.  ED Course:  In the emergency department, he was hemodynamically stable.  Work-up in the ED showed normal CBC, hypokalemia, BUN to creatinine 26/1.58 (no prior labs for comparison).  CBG 153, urinalysis was unimpressive for UTI. CT abdomen and pelvis with contrast showed findings consistent with a small bowel obstruction to the distal ileum with a focal area of narrowing in the right lower quadrant.  Chest x-ray showed no active disease.  NG-tube was placed.  IV Zofran and IV fentanyl were given.  Patient was started on IV hydration.  Hospitalist was asked to admit patient for further evaluation and management.  Review of Systems: Constitutional: Negative for chills and fever.  HENT: Negative for ear pain and sore  throat.   Eyes: Negative for pain and visual disturbance.  Respiratory: Negative for cough, chest tightness and shortness of breath.   Cardiovascular: Negative for chest pain and palpitations.  Gastrointestinal: Positive for abdominal pain, diarrhea, nausea and vomiting.  Endocrine: Negative for polyphagia and polyuria.  Genitourinary: Negative for decreased urine volume, dysuria, enuresis, hematuria Musculoskeletal: Negative for arthralgias and back pain.  Skin: Negative for color change and rash.  Allergic/Immunologic: Negative for immunocompromised state.  Neurological: Negative for tremors, syncope, speech difficulty, weakness, light-headedness and headaches.  Hematological: Does not bruise/bleed easily.  All other systems reviewed and are negative  Past Medical History:  Diagnosis Date  . COPD (chronic obstructive pulmonary disease) (HCC)   . Hypertension    History reviewed. No pertinent surgical history.  Social History:  reports that he has quit smoking. He has never used smokeless tobacco. No history on file for alcohol use and drug use.   Allergies  Allergen Reactions  . Atorvastatin   . Lisinopril Cough    History reviewed. No pertinent family history.   Prior to Admission medications   Medication Sig Start Date End Date Taking? Authorizing Provider  albuterol (VENTOLIN HFA) 108 (90 Base) MCG/ACT inhaler Inhale 2 puffs into the lungs every 4 (four) hours as needed for wheezing or shortness of breath.   Yes [provider]  AMLODIPINE BESYLATE PO Take 10 mg by mouth daily.   Yes [provider]  aspirin EC 81 MG tablet Take 81 mg by mouth daily. Swallow whole.   Yes [provider]  budesonide-formoterol (SYMBICORT) 160-4.5 MCG/ACT inhaler Inhale 2 puffs into the lungs 2 (two) times daily.  Yes [provider]  cetirizine (ZYRTEC) 10 MG tablet Take 10 mg by mouth daily.   Yes [provider]  hydrochlorothiazide  (HYDRODIURIL) 25 MG tablet Take 25 mg by mouth daily.   Yes [provider]  losartan (COZAAR) 100 MG tablet Take 100 mg by mouth daily.   Yes [provider]  Menthol-Methyl Salicylate (THERA-GESIC) 0.5-15 % CREA Apply 1 application topically in the morning and at bedtime. Avoid face,groin,wash hands after use   Yes [provider]  Multiple Vitamin (MULTIVITAMIN) tablet Take 1 tablet by mouth daily.   Yes [provider]  omeprazole (PRILOSEC) 20 MG capsule Take 20 mg by mouth daily.   Yes [provider]  pramipexole (MIRAPEX) 0.125 MG tablet Take 0.125 mg by mouth at bedtime.   Yes [provider]  simvastatin (ZOCOR) 40 MG tablet Take 40 mg by mouth daily.   Yes [provider]  tiotropium (SPIRIVA) 18 MCG inhalation capsule Place 18 mcg into inhaler and inhale daily.   Yes [provider]  hyoscyamine (LEVSIN) 0.125 MG tablet Take by mouth every 6 (six) hours as needed. Patient not taking: No sig reported 04/07/20   [provider]    Physical Exam: BP 137/89 (BP Location: Left Arm)   Pulse 92   Temp 98.8 F (37.1 C) (Oral)   Resp 19   Ht 5\' 9"  (1.753 m)   Wt 104.3 kg   SpO2 93%   BMI 33.97 kg/m   . General: 80 y.o. year-old male well developed well nourished in no acute distress.  Alert and oriented x3. Marland Kitchen. HEENT: NCAT, EOMI . Neck: Supple, trachea medial . Cardiovascular: Regular rate and rhythm with no rubs or gallops.  No thyromegaly or JVD noted.  No lower extremity edema. 2/4 pulses in all 4 extremities. Marland Kitchen. Respiratory: Clear to auscultation with no wheezes or rales. Good inspiratory effort. . Abdomen: Soft, tender to palpation, distended with decreased bowel sounds in upper quadrants . Muskuloskeletal: No cyanosis, clubbing or edema noted bilaterally . Neuro: CN II-XII intact, strength 5/5 x 4, sensation, reflexes intact . Skin: No ulcerative lesions noted or rashes . Psychiatry: Judgement and  insight appear normal. Mood is appropriate for condition and setting          Labs on Admission:  Basic Metabolic Panel: Recent Labs  Lab 04/08/20 2003  NA 134*  K 3.1*  CL 94*  CO2 26  GLUCOSE 153*  BUN 26*  CREATININE 1.58*  CALCIUM 9.4   Liver Function Tests: Recent Labs  Lab 04/08/20 2003  AST 27  ALT 42  ALKPHOS 51  BILITOT 1.1  PROT 7.4  ALBUMIN 3.9   Recent Labs  Lab 04/08/20 2003  LIPASE 21   No results for input(s): AMMONIA in the last 168 hours. CBC: Recent Labs  Lab 04/08/20 2003  WBC 9.9  HGB 16.6  HCT 47.1  MCV 90.9  PLT 325   Cardiac Enzymes: No results for input(s): CKTOTAL, CKMB, CKMBINDEX, TROPONINI in the last 168 hours.  BNP (last 3 results) No results for input(s): BNP in the last 8760 hours.  ProBNP (last 3 results) No results for input(s): PROBNP in the last 8760 hours.  CBG: No results for input(s): GLUCAP in the last 168 hours.  Radiological Exams on Admission: CT Abdomen Pelvis W Contrast  Result Date: 04/09/2020 CLINICAL DATA:  Abdominal distension and pain EXAM: CT ABDOMEN AND PELVIS WITH CONTRAST TECHNIQUE: Multidetector CT imaging of the abdomen and pelvis  was performed using the standard protocol following bolus administration of intravenous contrast. CONTRAST:  OMNIPAQUE IOHEXOL 300 MG/ML  SOLN COMPARISON:  None. FINDINGS: Lower chest: The visualized heart size within normal limits. No pericardial fluid/thickening. No hiatal hernia. The visualized portions of the lungs are clear. Hepatobiliary: The liver is normal in density without focal abnormality.The main portal vein is patent. Layering calcified gallstone is present. Pancreas: Fatty atrophy seen throughout the pancreas. No pancreatic ductal dilatation or surrounding inflammatory changes. Spleen: Normal in size without focal abnormality. Adrenals/Urinary Tract: Both adrenal glands appear normal. The kidneys and collecting system appear normal without evidence of  urinary tract calculus or hydronephrosis. Bladder is unremarkable. Stomach/Bowel: The stomach and proximal small bowel is unremarkable. There is dilated loops of distal jejunum and ileum measuring up to 4.3 cm with air and fluid down to the level of the distal ileum. Within the right lower quadrant there is a focal area of wall thickening and narrowing within the distal ileal loops, series 5, image 56. The remainder of the distal ileum and terminal ileum are decompressed. There is scattered colonic diverticula without diverticulitis. The appendix is unremarkable. Vascular/Lymphatic: There are no enlarged mesenteric, retroperitoneal, or pelvic lymph nodes. Scattered aortic atherosclerotic calcifications are seen without aneurysmal dilatation. Reproductive: The prostate is unremarkable. Other: Fat containing inguinal hernias are noted. Musculoskeletal: No acute or significant osseous findings. Degenerative changes seen throughout the visualized thoracolumbar spine. IMPRESSION: Findings consistent with a small-bowel obstruction to the distal ileum where there is a focal area of narrowing in the right lower quadrant. The terminal ileum appears to be decompressed. No free air or loculated fluid collections. Diverticulosis without diverticulitis Cholelithiasis Aortic Atherosclerosis (ICD10-I70.0). Electronically Signed   By: Jonna Clark M.D.   On: 04/09/2020 00:31   DG Chest Portable 1 View  Result Date: 04/09/2020 CLINICAL DATA:  NG tube placement EXAM: PORTABLE CHEST 1 VIEW COMPARISON:  April 09, 2020 FINDINGS: The enteric tube tip projects over the expected region of the gastric body. Dilated loops of bowel are noted in the upper abdomen. There are old healed left-sided rib fractures. IMPRESSION: NG tube projects over the gastric body. Electronically Signed   By: Katherine Mantle M.D.   On: 04/09/2020 02:39   DG Chest Port 1 View  Result Date: 04/09/2020 CLINICAL DATA:  Hypoxia EXAM: PORTABLE CHEST 1 VIEW  COMPARISON:  None. FINDINGS: The heart size and mediastinal contours are within normal limits. Aortic knob calcifications are seen. Both lungs are clear. The visualized skeletal structures are unremarkable. IMPRESSION: No active disease. Electronically Signed   By: Jonna Clark M.D.   On: 04/09/2020 00:40    EKG: I independently viewed the EKG done and my findings are as followed: EKG was not done in the ED  Assessment/Plan Present on Admission: . Small bowel obstruction (HCC)  Principal Problem:   Small bowel obstruction (HCC) Active Problems:   Nausea vomiting and diarrhea   Abdominal pain   Abdominal distention   Hypokalemia   AKI (acute kidney injury) (HCC)   Essential hypertension   COPD (chronic obstructive pulmonary disease) (HCC)   Obesity (BMI 30.0-34.9)   Nausea, vomiting, abdominal pain/bloating possibly secondary to small bowel obstruction  CT abdomen and pelvis with contrast showed findings consistent with a small bowel obstruction to the distal ileum with a focal area of narrowing in the right lower quadrant. Continue NG tube  Continue NPO at this time with plan to advance diet as tolerated Continue IV hydration Continue IV morphine  2 mg q.4h p.r.n. for moderate/severe pain Continue Zofran p.r.n. for nausea/vomiting General surgery will be consulted and we shall await their recommendation   Acute diarrhea Patient has not had diarrhea since arrival to the ED C. difficile pending  Hypokalemia K+ 3.1; this will be replenished  Acute kidney injury BUN to creatinine 26/1.58 (no prior labs for comparison) Continue IV hydration Renally adjust medications, avoid nephrotoxic agents/dehydration/hypotension  Hyperglycemia possibly reactive CBG 153, continue to monitor blood glucose level with morning labs  Essential hypertension (moderately controlled) Patient is currently n.p.o. Continue IV labetalol 5 mg every 6 hours for SBP > 180  COPD (not in acute  exacerbation) Continue Ventolin, Dulera and Spiriva  Obesity (BMI 33.97) Patient was counseled on diet and lifestyle modification  Hyperlipidemia Continue home med when patient resumes oral intake  GERD Continue IV Protonix  DVT prophylaxis: SCDs  Code Status: Full code  Family Communication: Son at bedside (all questions answered to satisfaction)  Disposition Plan:  Patient is from:                        home Anticipated DC to:                   SNF or family members home Anticipated DC date:               2-3 days Anticipated DC barriers:           Inpatient management required due to small bowel obstruction.  Surgical consult pending  Consults called: General surgery  Admission status: Inpatient    Frankey Shown MD [facility]  04/09/2020, 4:00 AM

## 2020-04-09 NOTE — Progress Notes (Signed)
PROGRESS NOTE    Patient: Melvin Melvin [name]                            PCP: Pcp, No                    DOB: 1941-01-07            DOA: 04/08/2020 ZOX:[id number]             DOS: 04/09/2020, 1:23 PM   LOS: 0 days   Date of Service: The patient was seen and examined on 04/09/2020  Subjective:   The patient was seen and examined this morning. Stable at this time. Still complaining of :  Otherwise no issues overnight .  Brief Narrative:   Melvin Melvin [name] is a 80 y.o. male with medical history significant for hypertension, COPD who presents to the emergency department due to 1 week onset of nausea, vomiting, diarrhea, abdominal pain and distention.   Work-up revealed small bowel obstruction to distal ileus with a focal area of narrowing in the right lower quadrant based on a CT scan findings  ED Course:  Hemodynamically stable, labs reviewed, noted for hypokalemia, AKI, NG-tube was placed.  IV Zofran and IV fentanyl were given.  Patient was started on IV hydration.   Assessment & Plan:   Principal Problem:   Small bowel obstruction (HCC) Active Problems:   Nausea vomiting and diarrhea   Abdominal pain   Abdominal distention   Hypokalemia   AKI (acute kidney injury) (HCC)   Essential hypertension   COPD (chronic obstructive pulmonary disease) (HCC)   Obesity (BMI 30.0-34.9)   Hyperglycemia   Hyperlipidemia   GERD (gastroesophageal reflux disease)   Small bowel obstruction -Associated with nausea vomiting abdominal pain, distention-improving -NG tube in place to wall suction.. -Reporting a bowel movement last night, gas this morning CT abdomen and pelvis with contrast showed findings consistent with a small bowel obstruction to the distal ileum with a focal area of narrowing in the right lower quadrant. -We will clamp the NG tube, keep the patient n.p.o. with exception of ice chips -General surgery consulted, following closely -appreciate follow-up and recommendations -Continue IV  hydration  Continue IV morphine 2 mg q.4h p.r.n. for moderate/severe pain Continue Zofran p.r.n. for nausea/vomiting   Acute diarrhea -Improved C. difficile negative  Hypokalemia K+ 3.1; this will be replenished -IV fluid with potassium supplements  Acute kidney injury Melvin [name] to creatinine 26/1.58 (no prior labs for comparison) >> creatinine 1.04 today -Improving with hydration Renally adjust medications, avoid nephrotoxic agents/dehydration/hypotension  Hyperglycemia possibly reactive CBG 153,... 122 this a.m. -Monitoring closely  Essential hypertension (moderately controlled) Patient is currently n.p.o. Continue IV labetalol 5 mg every 6 hours for SBP > 180 -Blood pressure has stabilized 134/69 this a.m.  COPD (not in acute exacerbation) Continue Ventolin, Dulera and Spiriva -In no respite distress, on room air satting 94%  Obesity (BMI 33.97) Patient was counseled on diet and lifestyle modification  Hyperlipidemia Continue home med when patient resumes oral intake  GERD Continue IV Protonix  DVT prophylaxis: SCDs  Code Status: Full code  Family Communication:  Discussed with patient  Disposition Plan:  Patient is from:home Anticipated DC to:SNF or family members home Anticipated DC date:2-3 days Anticipated DC barriers: Inpatient management required due to small bowel obstruction.  Surgical consult pending  Consults called: General surgery  Admission status: Inpatient  ------------------------------------------------------------------------------------------------------------------------------- Nutritional status:  The patient's BMI is: Body  mass index is 33.97 kg/m. I agree with the assessment and plan as outlined ....   Level of care: Med-Surg   Procedures:   No admission procedures for hospital encounter.     Antimicrobials:  Anti-infectives (From admission,  onward)   None       Medication:  . docusate  50 mg Per Tube Daily  . enoxaparin (LOVENOX) injection  50 mg Subcutaneous Q24H  . mometasone-formoterol  2 puff Inhalation BID  . pantoprazole (PROTONIX) IV  40 mg Intravenous Q24H  . umeclidinium bromide  1 puff Inhalation Daily    albuterol, labetalol, morphine injection, ondansetron (ZOFRAN) IV, phenol   Objective:   Vitals:   04/09/20 0200 04/09/20 0324 04/09/20 0556 04/09/20 0810  BP: (!) 145/80 137/89 134/69   Pulse: 98 92 85   Resp: 19  20   Temp:  98.8 F (37.1 C) 98.4 F (36.9 C)   TempSrc:  Oral    SpO2: 95% 93% 94% 94%  Weight:      Height:        Intake/Output Summary (Last 24 hours) at 04/09/2020 1323 Last data filed at 04/09/2020 0440 Gross per 24 hour  Intake 1477.71 ml  Output --  Net 1477.71 ml   Filed Weights   04/08/20 1915  Weight: 104.3 kg     Examination:   Physical Exam  Constitution:  Alert, cooperative, no distress,  Appears calm and comfortable  Psychiatric: Normal and stable mood and affect, cognition intact,   HEENT: Normocephalic, PERRL, otherwise with in Normal limits  Chest:Chest symmetric Cardio vascular:  S1/S2, RRR, No murmure, No Rubs or Gallops  pulmonary: Clear to auscultation bilaterally, respirations unlabored, negative wheezes / crackles Abdomen: Soft, mild diffuse tenderness, mildly distended, positive bowel sounds but hypoactive,no masses, no organomegaly Muscular skeletal: Limited exam - in bed, able to move all 4 extremities, Normal strength,  Neuro: CNII-XII intact. , normal motor and sensation, reflexes intact  Extremities: No pitting edema lower extremities, +2 pulses  Skin: Dry, warm to touch, negative for any Rashes, No open wounds Wounds: per nursing documentation    ------------------------------------------------------------------------------------------------------------------------------------------    LABs:  CBC Latest Ref Rng & Units 04/09/2020 04/08/2020   WBC 4.0 - 10.5 K/uL 7.7 9.9  Hemoglobin 13.0 - 17.0 g/dL 93.2 67.1  Hematocrit 24.5 - 52.0 % 42.9 47.1  Platelets 150 - 400 K/uL 258 325   CMP Latest Ref Rng & Units 04/09/2020 04/08/2020  Glucose 70 - 99 mg/dL 809(X) 833(A)  Melvin [name] 8 - 23 mg/dL 25(K) 53(Z)  Creatinine 0.61 - 1.24 mg/dL 7.67 3.41(P)  Sodium 379 - 145 mmol/L 136 134(L)  Potassium 3.5 - 5.1 mmol/L 3.3(L) 3.1(L)  Chloride 98 - 111 mmol/L 96(L) 94(L)  CO2 22 - 32 mmol/L 28 26  Calcium 8.9 - 10.3 mg/dL 0.2(I) 9.4  Total Protein 6.5 - 8.1 g/dL 6.3(L) 7.4  Total Bilirubin 0.3 - 1.2 mg/dL 0.8 1.1  Alkaline Phos 38 - 126 U/L 43 51  AST 15 - 41 U/L 21 27  ALT 0 - 44 U/L 35 42       Micro Results Recent Results (from the past 240 hour(s))  Resp Panel by RT-PCR (Flu A&B, Covid) Nasopharyngeal Swab     Status: None   Collection Time: 04/09/20  1:52 AM   Specimen: Nasopharyngeal Swab; Nasopharyngeal(NP) swabs in vial transport medium  Result Value Ref Range Status   SARS Coronavirus 2 by RT PCR NEGATIVE NEGATIVE Final    Comment: (NOTE) SARS-CoV-2 target  nucleic acids are NOT DETECTED.  The SARS-CoV-2 RNA is generally detectable in upper respiratory specimens during the acute phase of infection. The lowest concentration of SARS-CoV-2 viral copies this assay can detect is 138 copies/mL. A negative result does not preclude SARS-Cov-2 infection and should not be used as the sole basis for treatment or other patient management decisions. A negative result may occur with  improper specimen collection/handling, submission of specimen other than nasopharyngeal swab, presence of viral mutation(s) within the areas targeted by this assay, and inadequate number of viral copies(<138 copies/mL). A negative result must be combined with clinical observations, patient history, and epidemiological information. The expected result is Negative.  Fact Sheet for Patients:  BloggerCourse.com  Fact Sheet for  Healthcare Providers:  SeriousBroker.it  This test is no t yet approved or cleared by the [place] FDA and  has been authorized for detection and/or diagnosis of SARS-CoV-2 by FDA under an Emergency Use Authorization (EUA). This EUA will remain  in effect (meaning this test can be used) for the duration of the COVID-19 declaration under Section 564(b)(1) of the Act, 21 U.S.C.section 360bbb-3(b)(1), unless the authorization is terminated  or revoked sooner.       Influenza A by PCR NEGATIVE NEGATIVE Final   Influenza B by PCR NEGATIVE NEGATIVE Final    Comment: (NOTE) The Xpert Xpress SARS-CoV-2/FLU/RSV plus assay is intended as an aid in the diagnosis of influenza from Nasopharyngeal swab specimens and should not be used as a sole basis for treatment. Nasal washings and aspirates are unacceptable for Xpert Xpress SARS-CoV-2/FLU/RSV testing.  Fact Sheet for Patients: BloggerCourse.com  Fact Sheet for Healthcare Providers: SeriousBroker.it  This test is not yet approved or cleared by the [place] FDA and has been authorized for detection and/or diagnosis of SARS-CoV-2 by FDA under an Emergency Use Authorization (EUA). This EUA will remain in effect (meaning this test can be used) for the duration of the COVID-19 declaration under Section 564(b)(1) of the Act, 21 U.S.C. section 360bbb-3(b)(1), unless the authorization is terminated or revoked.  Performed at [facility], [address], [place], [place] [address]     Radiology Reports DG Abd 1 View  Result Date: 04/09/2020 CLINICAL DATA:  Nausea and vomiting EXAM: ABDOMEN - 1 VIEW COMPARISON:  CT abdomen 04/08/2020. FINDINGS: Moderate small bowel distension. Colon decompressed with small amount of gas. Upright view not obtained. NG tube in the proximal stomach. Contrast in the urinary bladder from CT IMPRESSION: Diffuse small bowel dilatation  compatible small bowel obstruction. NG tube in the proximal stomach. Electronically Signed   By: Marlan Palau M.D.   On: 04/09/2020 11:09   CT Abdomen Pelvis W Contrast  Result Date: 04/09/2020 CLINICAL DATA:  Abdominal distension and pain EXAM: CT ABDOMEN AND PELVIS WITH CONTRAST TECHNIQUE: Multidetector CT imaging of the abdomen and pelvis was performed using the standard protocol following bolus administration of intravenous contrast. CONTRAST:  OMNIPAQUE IOHEXOL 300 MG/ML  SOLN COMPARISON:  None. FINDINGS: Lower chest: The visualized heart size within normal limits. No pericardial fluid/thickening. No hiatal hernia. The visualized portions of the lungs are clear. Hepatobiliary: The liver is normal in density without focal abnormality.The main portal vein is patent. Layering calcified gallstone is present. Pancreas: Fatty atrophy seen throughout the pancreas. No pancreatic ductal dilatation or surrounding inflammatory changes. Spleen: Normal in size without focal abnormality. Adrenals/Urinary Tract: Both adrenal glands appear normal. The kidneys and collecting system appear normal without evidence of urinary tract calculus or hydronephrosis. Bladder  is unremarkable. Stomach/Bowel: The stomach and proximal small bowel is unremarkable. There is dilated loops of distal jejunum and ileum measuring up to 4.3 cm with air and fluid down to the level of the distal ileum. Within the right lower quadrant there is a focal area of wall thickening and narrowing within the distal ileal loops, series 5, image 56. The remainder of the distal ileum and terminal ileum are decompressed. There is scattered colonic diverticula without diverticulitis. The appendix is unremarkable. Vascular/Lymphatic: There are no enlarged mesenteric, retroperitoneal, or pelvic lymph nodes. Scattered aortic atherosclerotic calcifications are seen without aneurysmal dilatation. Reproductive: The prostate is unremarkable. Other: Fat containing  inguinal hernias are noted. Musculoskeletal: No acute or significant osseous findings. Degenerative changes seen throughout the visualized thoracolumbar spine. IMPRESSION: Findings consistent with a small-bowel obstruction to the distal ileum where there is a focal area of narrowing in the right lower quadrant. The terminal ileum appears to be decompressed. No free air or loculated fluid collections. Diverticulosis without diverticulitis Cholelithiasis Aortic Atherosclerosis (ICD10-I70.0). Electronically Signed   By: Jonna Clark M.D.   On: 04/09/2020 00:31   DG Chest Portable 1 View  Result Date: 04/09/2020 CLINICAL DATA:  NG tube placement EXAM: PORTABLE CHEST 1 VIEW COMPARISON:  April 09, 2020 FINDINGS: The enteric tube tip projects over the expected region of the gastric body. Dilated loops of bowel are noted in the upper abdomen. There are old healed left-sided rib fractures. IMPRESSION: NG tube projects over the gastric body. Electronically Signed   By: Katherine Mantle M.D.   On: 04/09/2020 02:39   DG Chest Port 1 View  Result Date: 04/09/2020 CLINICAL DATA:  Hypoxia EXAM: PORTABLE CHEST 1 VIEW COMPARISON:  None. FINDINGS: The heart size and mediastinal contours are within normal limits. Aortic knob calcifications are seen. Both lungs are clear. The visualized skeletal structures are unremarkable. IMPRESSION: No active disease. Electronically Signed   By: Jonna Clark M.D.   On: 04/09/2020 00:40    SIGNED: Kendell Bane, MD, FHM. [facility],  Pager (please use [email or web address] to page/text) Please use Epic Secure Chat for non-urgent communication (7AM-7PM)  If 7PM-7AM, please contact night-coverage [email or web address], 04/09/2020, 1:23 PM

## 2020-04-10 DIAGNOSIS — K56609 Unspecified intestinal obstruction, unspecified as to partial versus complete obstruction: Secondary | ICD-10-CM | POA: Diagnosis not present

## 2020-04-10 LAB — BASIC METABOLIC PANEL
Anion gap: 12 (ref 5–15)
BUN: 17 mg/dL (ref 8–23)
CO2: 25 mmol/L (ref 22–32)
Calcium: 8.4 mg/dL — ABNORMAL LOW (ref 8.9–10.3)
Chloride: 98 mmol/L (ref 98–111)
Creatinine, Ser: 0.74 mg/dL (ref 0.61–1.24)
GFR, Estimated: >60 mL/min (ref >60)
Glucose, Bld: 110 mg/dL — ABNORMAL HIGH (ref 70–99)
Potassium: 3.3 mmol/L — ABNORMAL LOW (ref 3.5–5.1)
Sodium: 135 mmol/L (ref 135–145)

## 2020-04-10 LAB — C DIFFICILE QUICK SCREEN W PCR REFLEX
C Diff antigen: NEGATIVE
C Diff interpretation: NOT DETECTED
C Diff toxin: NEGATIVE

## 2020-04-10 MED ORDER — POTASSIUM CHLORIDE CRYS ER 20 MEQ PO TBCR
40.0000 meq | EXTENDED_RELEASE_TABLET | Freq: Once | ORAL | Status: AC
  Administered 2020-04-10: 40 meq via ORAL
  Filled 2020-04-10: qty 2

## 2020-04-10 MED ORDER — ADULT MULTIVITAMIN W/MINERALS CH
1.0000 | ORAL_TABLET | Freq: Every day | ORAL | Status: DC
  Administered 2020-04-10: 1 via ORAL
  Filled 2020-04-10: qty 1

## 2020-04-10 MED ORDER — ASPIRIN EC 81 MG PO TBEC
81.0000 mg | DELAYED_RELEASE_TABLET | Freq: Every day | ORAL | Status: DC
  Administered 2020-04-10: 81 mg via ORAL
  Filled 2020-04-10: qty 1

## 2020-04-10 MED ORDER — AMLODIPINE BESYLATE 5 MG PO TABS
10.0000 mg | ORAL_TABLET | Freq: Every day | ORAL | Status: DC
  Administered 2020-04-10: 10 mg via ORAL
  Filled 2020-04-10: qty 2

## 2020-04-10 MED ORDER — SIMVASTATIN 20 MG PO TABS
40.0000 mg | ORAL_TABLET | Freq: Every day | ORAL | Status: DC
  Administered 2020-04-10: 40 mg via ORAL
  Filled 2020-04-10: qty 2

## 2020-04-10 MED ORDER — PRAMIPEXOLE DIHYDROCHLORIDE 0.125 MG PO TABS
0.1250 mg | ORAL_TABLET | Freq: Every day | ORAL | Status: DC
  Filled 2020-04-10: qty 1

## 2020-04-10 MED ORDER — DOCUSATE SODIUM 50 MG/5ML PO LIQD
100.0000 mg | Freq: Every day | ORAL | Status: DC
  Filled 2020-04-10 (×2): qty 10

## 2020-04-10 NOTE — Discharge Summary (Signed)
Physician Discharge Summary [facility]    Patient: Melvin [name]                   Admit date: 04/08/2020   DOB: 10-07-1940             Discharge date:04/10/2020/11:58 AM EPP:[id number]                          PCP: Pcp, No  Disposition: HOME  Recommendations for Outpatient Follow-up:   . Follow up: in 1 week  Discharge Condition: Stable   Code Status:   Code Status: Full Code  Diet recommendation: Regular healthy diet   Discharge Diagnoses:    Principal Problem:   Small bowel obstruction (HCC) Active Problems:   Nausea vomiting and diarrhea   Abdominal pain   Abdominal distention   Hypokalemia   AKI (acute kidney injury) (HCC)   Essential hypertension   COPD (chronic obstructive pulmonary disease) (HCC)   Obesity (BMI 30.0-34.9)   Hyperglycemia   Hyperlipidemia   GERD (gastroesophageal reflux disease)   History of Present Illness/ Hospital Course Charline Bills/Brief Summary:    Melvin [name] a 80 y.o.malewith medical history significant forhypertension, COPD who presents to the emergency department due to 1 week onset of nausea, vomiting, diarrhea, abdominal pain and distention.  Work-up revealed small bowel obstruction to distal ileus with a focal area of narrowing in the right lower quadrant based on a CT scan findings  ED Course: Hemodynamically stable, labs reviewed, noted for hypokalemia, AKI,NG-tube was placed. IV Zofran and IV fentanyl were given. Patient was started on IV hydration.   Small bowel obstruction -Associated with nausea vomiting abdominal pain, distention-improving -NG tube in place to wall suction.... Discontinued -Bowel function has returned, positive BMs, gas -Tolerating p.o.   CT abdomen and pelvis with contrast showed findings consistent with a small bowel obstruction to the distal ileum with a focal area of narrowing in the right lower quadrant.  -General surgery consulted, was following closely -cleared the patient for  discharge -Status post fluid hydration, pain management -Symptoms much improved   Acute diarrhea -Improved C. difficile negative -Discontinued stool softeners, laxatives  Hypokalemia K+3.1; -IV fluid with potassium supplements -Resolved  Acute kidney injury -Improved BUN to creatinine 26/1.58 (no prior labs for comparison) >> creatinine 1.04 -Improved with hydration, discontinued home medication losartan, HCTZ   Hyperglycemiapossibly reactive CBG 153,... 122 this a.m. -Monitoring closely  Essential hypertension(moderately controlled) Blood pressure has been stable since admission, continue home dose Norvasc Would hold, currently discontinued losartan, HCTZ. Follow with PCP monitor blood pressure closely  COPD (not in acute exacerbation) Continue Ventolin, Dulera and Spiriva -In no respite distress, on room air satting 94%  Obesity (BMI 33.97) Patient was counseled on diet and lifestyle modification  Hyperlipidemia Continue home dose statin  GERD Continue IV Protonix >>> switch to p.o.   Code Status:Full code Family Communication: Discussed with patient Disposition Plan: Patient is from:home Anticipated DC SA:YTKZto:Home       Discharge Instructions:   Discharge Instructions    Activity as tolerated - No restrictions   Complete by: As directed    Diet - low sodium heart healthy   Complete by: As directed    Discharge instructions   Complete by: As directed    Continue soft diet for next day or 2 advance as tolerated   Increase activity slowly   Complete by: As directed        Medication List  STOP taking these medications   hydrochlorothiazide 25 MG tablet Commonly known as: HYDRODIURIL   hyoscyamine 0.125 MG tablet Commonly known as: LEVSIN   losartan 100 MG tablet Commonly known as: COZAAR     TAKE these medications   albuterol 108 (90 Base) MCG/ACT inhaler Commonly known  as: VENTOLIN HFA Inhale 2 puffs into the lungs every 4 (four) hours as needed for wheezing or shortness of breath.   AMLODIPINE BESYLATE PO Take 10 mg by mouth daily.   aspirin EC 81 MG tablet Take 81 mg by mouth daily. Swallow whole.   budesonide-formoterol 160-4.5 MCG/ACT inhaler Commonly known as: SYMBICORT Inhale 2 puffs into the lungs 2 (two) times daily.   cetirizine 10 MG tablet Commonly known as: ZYRTEC Take 10 mg by mouth daily.   multivitamin tablet Take 1 tablet by mouth daily.   omeprazole 20 MG capsule Commonly known as: PRILOSEC Take 20 mg by mouth daily.   pramipexole 0.125 MG tablet Commonly known as: MIRAPEX Take 0.125 mg by mouth at bedtime.   simvastatin 40 MG tablet Commonly known as: ZOCOR Take 40 mg by mouth daily.   Thera-Gesic 0.5-15 % Crea Apply 1 application topically in the morning and at bedtime. Avoid face,groin,wash hands after use   tiotropium 18 MCG inhalation capsule Commonly known as: SPIRIVA Place 18 mcg into inhaler and inhale daily.       Follow-up Information    Lucretia Roers, MD Follow up on 04/24/2020.   Specialty: General Surgery Why: CT enterography will need to be ordered as outpatient  Contact information: [address] [place] [facility] [address] [phone]              Allergies  Allergen Reactions  . Atorvastatin   . Lisinopril Cough     Procedures /Studies:   DG Abd 1 View  Result Date: 04/09/2020 CLINICAL DATA:  Nausea and vomiting EXAM: ABDOMEN - 1 VIEW COMPARISON:  CT abdomen 04/08/2020. FINDINGS: Moderate small bowel distension. Colon decompressed with small amount of gas. Upright view not obtained. NG tube in the proximal stomach. Contrast in the urinary bladder from CT IMPRESSION: Diffuse small bowel dilatation compatible small bowel obstruction. NG tube in the proximal stomach. Electronically Signed   By: Marlan Palau M.D.   On: 04/09/2020 11:09   CT Abdomen Pelvis W Contrast  Result  Date: 04/09/2020 CLINICAL DATA:  Abdominal distension and pain EXAM: CT ABDOMEN AND PELVIS WITH CONTRAST TECHNIQUE: Multidetector CT imaging of the abdomen and pelvis was performed using the standard protocol following bolus administration of intravenous contrast. CONTRAST:  OMNIPAQUE IOHEXOL 300 MG/ML  SOLN COMPARISON:  None. FINDINGS: Lower chest: The visualized heart size within normal limits. No pericardial fluid/thickening. No hiatal hernia. The visualized portions of the lungs are clear. Hepatobiliary: The liver is normal in density without focal abnormality.The main portal vein is patent. Layering calcified gallstone is present. Pancreas: Fatty atrophy seen throughout the pancreas. No pancreatic ductal dilatation or surrounding inflammatory changes. Spleen: Normal in size without focal abnormality. Adrenals/Urinary Tract: Both adrenal glands appear normal. The kidneys and collecting system appear normal without evidence of urinary tract calculus or hydronephrosis. Bladder is unremarkable. Stomach/Bowel: The stomach and proximal small bowel is unremarkable. There is dilated loops of distal jejunum and ileum measuring up to 4.3 cm with air and fluid down to the level of the distal ileum. Within the right lower quadrant there is a focal area of wall thickening and narrowing within the distal ileal loops, series 5,  image 56. The remainder of the distal ileum and terminal ileum are decompressed. There is scattered colonic diverticula without diverticulitis. The appendix is unremarkable. Vascular/Lymphatic: There are no enlarged mesenteric, retroperitoneal, or pelvic lymph nodes. Scattered aortic atherosclerotic calcifications are seen without aneurysmal dilatation. Reproductive: The prostate is unremarkable. Other: Fat containing inguinal hernias are noted. Musculoskeletal: No acute or significant osseous findings. Degenerative changes seen throughout the visualized thoracolumbar spine. IMPRESSION: Findings  consistent with a small-bowel obstruction to the distal ileum where there is a focal area of narrowing in the right lower quadrant. The terminal ileum appears to be decompressed. No free air or loculated fluid collections. Diverticulosis without diverticulitis Cholelithiasis Aortic Atherosclerosis (ICD10-I70.0). Electronically Signed   By: Jonna Clark M.D.   On: 04/09/2020 00:31   DG Chest Portable 1 View  Result Date: 04/09/2020 CLINICAL DATA:  NG tube placement EXAM: PORTABLE CHEST 1 VIEW COMPARISON:  April 09, 2020 FINDINGS: The enteric tube tip projects over the expected region of the gastric body. Dilated loops of bowel are noted in the upper abdomen. There are old healed left-sided rib fractures. IMPRESSION: NG tube projects over the gastric body. Electronically Signed   By: Katherine Mantle M.D.   On: 04/09/2020 02:39   DG Chest Port 1 View  Result Date: 04/09/2020 CLINICAL DATA:  Hypoxia EXAM: PORTABLE CHEST 1 VIEW COMPARISON:  None. FINDINGS: The heart size and mediastinal contours are within normal limits. Aortic knob calcifications are seen. Both lungs are clear. The visualized skeletal structures are unremarkable. IMPRESSION: No active disease. Electronically Signed   By: Jonna Clark M.D.   On: 04/09/2020 00:40     Subjective:   Patient was seen and examined 04/10/2020, 11:58 AM Patient stable today. No acute distress.  No issues overnight Stable for discharge.  Discharge Exam:    Vitals:   04/09/20 1959 04/09/20 2024 04/10/20 0346 04/10/20 0826  BP: (!) 149/96  125/79   Pulse: 100  87   Resp: 19  16   Temp: 98.6 F (37 C)  (!) 97.3 F (36.3 C)   TempSrc: Oral  Oral   SpO2: 90% 93% 93% 94%  Weight:      Height:        General: Pt lying comfortably in bed & appears in no obvious distress. Cardiovascular: S1 & S2 heard, RRR, S1/S2 +. No murmurs, rubs, gallops or clicks. No JVD or pedal edema. Respiratory: Clear to auscultation without wheezing, rhonchi or crackles. No  increased work of breathing. Abdominal:  Non-distended, non-tender & soft. No organomegaly or masses appreciated. Normal bowel sounds heard. CNS: Alert and oriented. No focal deficits. Extremities: no edema, no cyanosis      The results of significant diagnostics from this hospitalization (including imaging, microbiology, ancillary and laboratory) are listed below for reference.      Microbiology:   Recent Results (from the past 240 hour(s))  Resp Panel by RT-PCR (Flu A&B, Covid) Nasopharyngeal Swab     Status: None   Collection Time: 04/09/20  1:52 AM   Specimen: Nasopharyngeal Swab; Nasopharyngeal(NP) swabs in vial transport medium  Result Value Ref Range Status   SARS Coronavirus 2 by RT PCR NEGATIVE NEGATIVE Final    Comment: (NOTE) SARS-CoV-2 target nucleic acids are NOT DETECTED.  The SARS-CoV-2 RNA is generally detectable in upper respiratory specimens during the acute phase of infection. The lowest concentration of SARS-CoV-2 viral copies this assay can detect is 138 copies/mL. A negative result does not preclude SARS-Cov-2 infection and should not  be used as the sole basis for treatment or other patient management decisions. A negative result may occur with  improper specimen collection/handling, submission of specimen other than nasopharyngeal swab, presence of viral mutation(s) within the areas targeted by this assay, and inadequate number of viral copies(<138 copies/mL). A negative result must be combined with clinical observations, patient history, and epidemiological information. The expected result is Negative.  Fact Sheet for Patients:  BloggerCourse.com  Fact Sheet for Healthcare Providers:  SeriousBroker.it  This test is no t yet approved or cleared by the [place] FDA and  has been authorized for detection and/or diagnosis of SARS-CoV-2 by FDA under an Emergency Use Authorization (EUA). This EUA will  remain  in effect (meaning this test can be used) for the duration of the COVID-19 declaration under Section 564(b)(1) of the Act, 21 U.S.C.section 360bbb-3(b)(1), unless the authorization is terminated  or revoked sooner.       Influenza A by PCR NEGATIVE NEGATIVE Final   Influenza B by PCR NEGATIVE NEGATIVE Final    Comment: (NOTE) The Xpert Xpress SARS-CoV-2/FLU/RSV plus assay is intended as an aid in the diagnosis of influenza from Nasopharyngeal swab specimens and should not be used as a sole basis for treatment. Nasal washings and aspirates are unacceptable for Xpert Xpress SARS-CoV-2/FLU/RSV testing.  Fact Sheet for Patients: BloggerCourse.com  Fact Sheet for Healthcare Providers: SeriousBroker.it  This test is not yet approved or cleared by the [place] FDA and has been authorized for detection and/or diagnosis of SARS-CoV-2 by FDA under an Emergency Use Authorization (EUA). This EUA will remain in effect (meaning this test can be used) for the duration of the COVID-19 declaration under Section 564(b)(1) of the Act, 21 U.S.C. section 360bbb-3(b)(1), unless the authorization is terminated or revoked.  Performed at [facility], [address], [place], [place] [address]   C Difficile Quick Screen w PCR reflex     Status: None   Collection Time: 04/09/20  9:50 PM   Specimen: STOOL  Result Value Ref Range Status   C Diff antigen NEGATIVE NEGATIVE Final   C Diff toxin NEGATIVE NEGATIVE Final   C Diff interpretation No C. difficile detected.  Final    Comment: Performed at [facility], [address], [place], [place] [address]     Labs:   CBC: Recent Labs  Lab 04/08/20 2003 04/09/20 0559  WBC 9.9 7.7  HGB 16.6 14.9  HCT 47.1 42.9  MCV 90.9 92.9  PLT 325 258   Basic Metabolic Panel: Recent Labs  Lab 04/08/20 2003 04/09/20 0559 04/10/20 0525  NA 134* 136 135  K 3.1* 3.3* 3.3*  CL 94* 96* 98  CO2 GLUCOSE 153* 122* 110*  BUN 26* 26* 17  CREATININE 1.58* 1.04 0.74  CALCIUM 9.4 8.5* 8.4*  MG  --  1.8  --   PHOS  --  3.6  --    Liver Function Tests: Recent Labs  Lab 04/08/20 2003 04/09/20 0559  AST 27 21  ALT 42 35  ALKPHOS 51 43  BILITOT 1.1 0.8  PROT 7.4 6.3*  ALBUMIN 3.9 3.4*   BNP (last 3 results) No results for input(s): BNP in the last 8760 hours. Cardiac Enzymes: No results for input(s): CKTOTAL, CKMB, CKMBINDEX, TROPONINI in the last 168 hours. CBG: No results for input(s): GLUCAP in the last 168 hours. Hgb A1c No results for input(s): HGBA1C in the last 72 hours. Lipid Profile No results for input(s): CHOL,  HDL, LDLCALC, TRIG, CHOLHDL, LDLDIRECT in the last 72 hours. Thyroid function studies No results for input(s): TSH, T4TOTAL, T3FREE, THYROIDAB in the last 72 hours.  Invalid input(s): FREET3 Anemia work up No results for input(s): VITAMINB12, FOLATE, FERRITIN, TIBC, IRON, RETICCTPCT in the last 72 hours. Urinalysis    Component Value Date/Time   COLORURINE AMBER (A) 04/08/2020 2332   APPEARANCEUR CLOUDY (A) 04/08/2020 2332   LABSPEC 1.021 04/08/2020 2332   PHURINE 5.0 04/08/2020 2332   GLUCOSEU NEGATIVE 04/08/2020 2332   HGBUR NEGATIVE 04/08/2020 2332   BILIRUBINUR NEGATIVE 04/08/2020 2332   KETONESUR 5 (A) 04/08/2020 2332   PROTEINUR 30 (A) 04/08/2020 2332   NITRITE NEGATIVE 04/08/2020 2332   LEUKOCYTESUR NEGATIVE 04/08/2020 2332         Time coordinating discharge: Over 45 minutes  SIGNED: Kendell Bane, MD, FACP, FHM. [facility],  Please use [email or web address] to Page If 7PM-7AM, please contact night-coverage [email or web address] 04/10/2020, 11:58 AM

## 2020-04-10 NOTE — Plan of Care (Signed)
Problem: Education: Goal: Knowledge of General Education information will improve Description: Including pain rating scale, medication(s)/side effects and non-pharmacologic comfort measures 04/10/2020 1256 by Felecia Jan, LPN Outcome: Adequate for Discharge 04/10/2020 1256 by Felecia Jan, LPN Outcome: Adequate for Discharge 04/10/2020 1042 by Felecia Jan, LPN Outcome: Progressing   Problem: Health Behavior/Discharge Planning: Goal: Ability to manage health-related needs will improve 04/10/2020 1256 by Felecia Jan, LPN Outcome: Adequate for Discharge 04/10/2020 1256 by Felecia Jan, LPN Outcome: Adequate for Discharge 04/10/2020 1042 by Felecia Jan, LPN Outcome: Progressing   Problem: Clinical Measurements: Goal: Ability to maintain clinical measurements within normal limits will improve 04/10/2020 1256 by Felecia Jan, LPN Outcome: Adequate for Discharge 04/10/2020 1256 by Felecia Jan, LPN Outcome: Adequate for Discharge 04/10/2020 1042 by Felecia Jan, LPN Outcome: Progressing Goal: Will remain free from infection 04/10/2020 1256 by Felecia Jan, LPN Outcome: Adequate for Discharge 04/10/2020 1256 by Felecia Jan, LPN Outcome: Adequate for Discharge 04/10/2020 1042 by Felecia Jan, LPN Outcome: Progressing Goal: Diagnostic test results will improve 04/10/2020 1256 by Felecia Jan, LPN Outcome: Adequate for Discharge 04/10/2020 1256 by Felecia Jan, LPN Outcome: Adequate for Discharge 04/10/2020 1042 by Felecia Jan, LPN Outcome: Progressing Goal: Respiratory complications will improve 04/10/2020 1256 by Felecia Jan, LPN Outcome: Adequate for Discharge 04/10/2020 1256 by Felecia Jan, LPN Outcome: Adequate for Discharge 04/10/2020 1042 by Felecia Jan, LPN Outcome: Progressing Goal: Cardiovascular complication will be avoided 04/10/2020 1256 by Felecia Jan, LPN Outcome: Adequate for  Discharge 04/10/2020 1256 by Felecia Jan, LPN Outcome: Adequate for Discharge 04/10/2020 1042 by Felecia Jan, LPN Outcome: Progressing   Problem: Activity: Goal: Risk for activity intolerance will decrease 04/10/2020 1256 by Felecia Jan, LPN Outcome: Adequate for Discharge 04/10/2020 1256 by Felecia Jan, LPN Outcome: Adequate for Discharge 04/10/2020 1042 by Felecia Jan, LPN Outcome: Progressing   Problem: Nutrition: Goal: Adequate nutrition will be maintained 04/10/2020 1256 by Felecia Jan, LPN Outcome: Adequate for Discharge 04/10/2020 1256 by Felecia Jan, LPN Outcome: Adequate for Discharge 04/10/2020 1042 by Felecia Jan, LPN Outcome: Progressing   Problem: Coping: Goal: Level of anxiety will decrease 04/10/2020 1256 by Felecia Jan, LPN Outcome: Adequate for Discharge 04/10/2020 1256 by Felecia Jan, LPN Outcome: Adequate for Discharge 04/10/2020 1042 by Felecia Jan, LPN Outcome: Progressing   Problem: Elimination: Goal: Will not experience complications related to bowel motility 04/10/2020 1256 by Felecia Jan, LPN Outcome: Adequate for Discharge 04/10/2020 1256 by Felecia Jan, LPN Outcome: Adequate for Discharge 04/10/2020 1042 by Felecia Jan, LPN Outcome: Progressing Goal: Will not experience complications related to urinary retention 04/10/2020 1256 by Felecia Jan, LPN Outcome: Adequate for Discharge 04/10/2020 1256 by Felecia Jan, LPN Outcome: Adequate for Discharge 04/10/2020 1042 by Felecia Jan, LPN Outcome: Progressing   Problem: Pain Managment: Goal: General experience of comfort will improve 04/10/2020 1256 by Felecia Jan, LPN Outcome: Adequate for Discharge 04/10/2020 1256 by Felecia Jan, LPN Outcome: Adequate for Discharge 04/10/2020 1042 by Felecia Jan, LPN Outcome: Progressing   Problem: Safety: Goal: Ability to remain free from injury will improve 04/10/2020 1256  by Felecia Jan, LPN Outcome: Adequate for Discharge 04/10/2020 1256 by Felecia Jan, LPN Outcome: Adequate for Discharge 04/10/2020 1042 by Felecia Jan, LPN Outcome: Progressing   Problem: Skin Integrity: Goal: Risk for impaired skin integrity will  decrease 04/10/2020 1256 by Felecia Jan, LPN Outcome: Adequate for Discharge 04/10/2020 1256 by Felecia Jan, LPN Outcome: Adequate for Discharge 04/10/2020 1042 by Felecia Jan, LPN Outcome: Progressing

## 2020-04-10 NOTE — Progress Notes (Signed)
[facility] Progress Note     Subjective: No issues. Eating and having Bms. No signs of obstruction clinically.   Objective: Vital signs in last 24 hours: Temp:  [97.3 F (36.3 C)-98.6 F (37 C)] 97.3 F (36.3 C) (03/03 0346) Pulse Rate:  [87-100] 87 (03/03 0346) Resp:  [16-19] 16 (03/03 0346) BP: (125-149)/(77-96) 125/79 (03/03 0346) SpO2:  [90 %-94 %] 94 % (03/03 0826) Last BM Date: 04/10/20  Intake/Output from previous day: 03/02 0701 - 03/03 0700 In: 1971.7 [I.V.:1971.7] Out: 300 [Urine:300] Intake/Output this shift: Total I/O In: 240 [P.O.:240] Out: -   General appearance: alert, cooperative, and no distress Resp: normal work of breathing GI: soft, non-tender; bowel sounds normal; no masses,  no organomegaly  Lab Results:  Recent Labs    04/08/20 2003 04/09/20 0559  WBC 9.9 7.7  HGB 16.6 14.9  HCT 47.1 42.9  PLT 325 258   BMET Recent Labs    04/09/20 0559 04/10/20 0525  NA 136 135  K 3.3* 3.3*  CL 96* 98  CO2 28 25  GLUCOSE 122* 110*  BUN 26* 17  CREATININE 1.04 0.74  CALCIUM 8.5* 8.4*   PT/INR Recent Labs    04/09/20 0559  LABPROT 14.7  INR 1.2    Studies/Results: DG Abd 1 View  Result Date: 04/09/2020 CLINICAL DATA:  Nausea and vomiting EXAM: ABDOMEN - 1 VIEW COMPARISON:  CT abdomen 04/08/2020. FINDINGS: Moderate small bowel distension. Colon decompressed with small amount of gas. Upright view not obtained. NG tube in the proximal stomach. Contrast in the urinary bladder from CT IMPRESSION: Diffuse small bowel dilatation compatible small bowel obstruction. NG tube in the proximal stomach. Electronically Signed   By: Marlan Palau M.D.   On: 04/09/2020 11:09   CT Abdomen Pelvis W Contrast  Result Date: 04/09/2020 CLINICAL DATA:  Abdominal distension and pain EXAM: CT ABDOMEN AND PELVIS WITH CONTRAST TECHNIQUE: Multidetector CT imaging of the abdomen and pelvis was performed using the standard protocol following bolus  administration of intravenous contrast. CONTRAST:  OMNIPAQUE IOHEXOL 300 MG/ML  SOLN COMPARISON:  None. FINDINGS: Lower chest: The visualized heart size within normal limits. No pericardial fluid/thickening. No hiatal hernia. The visualized portions of the lungs are clear. Hepatobiliary: The liver is normal in density without focal abnormality.The main portal vein is patent. Layering calcified gallstone is present. Pancreas: Fatty atrophy seen throughout the pancreas. No pancreatic ductal dilatation or surrounding inflammatory changes. Spleen: Normal in size without focal abnormality. Adrenals/Urinary Tract: Both adrenal glands appear normal. The kidneys and collecting system appear normal without evidence of urinary tract calculus or hydronephrosis. Bladder is unremarkable. Stomach/Bowel: The stomach and proximal small bowel is unremarkable. There is dilated loops of distal jejunum and ileum measuring up to 4.3 cm with air and fluid down to the level of the distal ileum. Within the right lower quadrant there is a focal area of wall thickening and narrowing within the distal ileal loops, series 5, image 56. The remainder of the distal ileum and terminal ileum are decompressed. There is scattered colonic diverticula without diverticulitis. The appendix is unremarkable. Vascular/Lymphatic: There are no enlarged mesenteric, retroperitoneal, or pelvic lymph nodes. Scattered aortic atherosclerotic calcifications are seen without aneurysmal dilatation. Reproductive: The prostate is unremarkable. Other: Fat containing inguinal hernias are noted. Musculoskeletal: No acute or significant osseous findings. Degenerative changes seen throughout the visualized thoracolumbar spine. IMPRESSION: Findings consistent with a small-bowel obstruction to the distal ileum where there is a focal area of narrowing in  the right lower quadrant. The terminal ileum appears to be decompressed. No free air or loculated fluid collections.  Diverticulosis without diverticulitis Cholelithiasis Aortic Atherosclerosis (ICD10-I70.0). Electronically Signed   By: Jonna Clark M.D.   On: 04/09/2020 00:31   DG Chest Portable 1 View  Result Date: 04/09/2020 CLINICAL DATA:  NG tube placement EXAM: PORTABLE CHEST 1 VIEW COMPARISON:  April 09, 2020 FINDINGS: The enteric tube tip projects over the expected region of the gastric body. Dilated loops of bowel are noted in the upper abdomen. There are old healed left-sided rib fractures. IMPRESSION: NG tube projects over the gastric body. Electronically Signed   By: Katherine Mantle M.D.   On: 04/09/2020 02:39   DG Chest Port 1 View  Result Date: 04/09/2020 CLINICAL DATA:  Hypoxia EXAM: PORTABLE CHEST 1 VIEW COMPARISON:  None. FINDINGS: The heart size and mediastinal contours are within normal limits. Aortic knob calcifications are seen. Both lungs are clear. The visualized skeletal structures are unremarkable. IMPRESSION: No active disease. Electronically Signed   By: Jonna Clark M.D.   On: 04/09/2020 00:40    Anti-infectives: Anti-infectives (From admission, onward)    None       Assessment/Plan: Mr. Gladman presented with nausea, vomiting, diarrhea, and likely had a viral enteritis. He had some thickening of the ileum, will need to get CT enterography in a few weeks. He is a [place] patient, so will need to ensure they will cover this image.    LOS: 1 day    Lucretia Roers 04/10/2020

## 2020-04-10 NOTE — Progress Notes (Signed)
Patient tolerated clear liquids without difficulty , upgraded to soft diet.

## 2020-04-10 NOTE — Plan of Care (Signed)

## 2020-04-10 NOTE — Progress Notes (Signed)
Initial Nutrition Assessment  DOCUMENTATION CODES:   Obesity unspecified  INTERVENTION:  Ensure Enlive po BID, each supplement provides 350 kcal and 20 grams of protein  Continue MVI with minerals po daily  Soft diet education  Unable to order Ensure at this time, orders have been reconciled for discharge  NUTRITION DIAGNOSIS:   Inadequate oral intake related to acute illness (SBO) as evidenced by per patient/family report.    GOAL:   Patient will meet greater than or equal to 90% of their needs    MONITOR:   PO intake,Weight trends,Labs,I & O's,Diet advancement,Supplement acceptance,Skin  REASON FOR ASSESSMENT:   Malnutrition Screening Tool    ASSESSMENT:  80 year old male admitted with SBO. Past medical history of HTN and COPD presented with 1 week onset of nausea, vomiting, diarrhea, abdominal pain, and distention as well as 2 episodes of nonbloody, nonbilious vomiting and poor oral intake.  Pt fully dressed in clothes from home, sitting in bedside chair reading the paper. Reports feeling better and ready to go home. Pt reports tolerating clear liquid breakfast and diet advanced to soft. Pt recalls decreased appetite over the past week and likely has had some wt loss, states his wife commented on his face looking thinner. Per chart he is ~6 lbs (2.4%) under is reported usual 235 lbs; insignificant. RD educated on soft diet and recommended nutrition supplement to help him meet his needs. Pt appreciative and agreeable to trying Ensure supplement, will order BID.   Medications reviewed and include: MVI  Labs: K 3.3 (L)   NUTRITION - FOCUSED PHYSICAL EXAM:  Unable to complete at this time, pt fully dressed in clothes from home  Diet Order:   Diet Order            DIET SOFT Room service appropriate? Yes; Fluid consistency: Thin  Diet effective now           Diet - low sodium heart healthy                 EDUCATION NEEDS:   Education needs have been  addressed  Skin:  Skin Assessment: Reviewed RN Assessment  Last BM:  3/3 type 7  Height:   Ht Readings from Last 1 Encounters:  04/08/20 5\' 9"  (1.753 m)    Weight:   Wt Readings from Last 1 Encounters:  04/08/20 104.3 kg    BMI:  Body mass index is 33.97 kg/m.  Estimated Nutritional Needs:   Kcal:  06/08/20  Protein:  120-135  Fluid:  >/= 2.2 L   [id number], RD, LDN Clinical Nutrition After Hours/Weekend Pager # in Amion

## 2020-04-10 NOTE — Progress Notes (Signed)
Patient discharged with personal belongings, discharge instructions and follow up appointments, IV  Removed earlier by patient from [facility],. Picked up by friend via personal vehicle without incident.

## 2020-04-12 ENCOUNTER — Telehealth (INDEPENDENT_AMBULATORY_CARE_PROVIDER_SITE_OTHER): Payer: No Typology Code available for payment source | Admitting: General Surgery

## 2020-04-12 DIAGNOSIS — R112 Nausea with vomiting, unspecified: Secondary | ICD-10-CM

## 2020-04-12 DIAGNOSIS — R197 Diarrhea, unspecified: Secondary | ICD-10-CM

## 2020-04-12 NOTE — Telephone Encounter (Signed)
[facility]  Patient admitted with some gastroenteritis and thickening of the ileum. He has been having diarrhea now. C dif was negative. Has been going 10 times a day liquid.   He is eating BRAT type diet. Potato soup, rice crispy. Told him to hold on milk and grease.   He can take some imodium 2mg  with meals as needed.  He will call the office on Monday and let Thursday know how he is doing.  Stay hydrated.   [place], MD [facility] [address] [place], [place] [phone] (office)

## 2020-04-14 ENCOUNTER — Telehealth (INDEPENDENT_AMBULATORY_CARE_PROVIDER_SITE_OTHER): Payer: No Typology Code available for payment source | Admitting: General Surgery

## 2020-04-14 DIAGNOSIS — R197 Diarrhea, unspecified: Secondary | ICD-10-CM

## 2020-04-14 NOTE — Telephone Encounter (Signed)
[facility]  Says he is in rough shape. Having lots of diarrhea. He has been taking imodium. He took 2mg  X 3 and did well with that but then he had more diarrhea.   When he takes the imodium then it is controlled otherwise he is going 7 times a day.   He has eaten Banana and he is staying hydrated.  BRAT diet.   Will repeat labs CBC and BMP and stool sample. He is a patient and does not have PCP here.   Plans for CT enterography in a few weeks given the ileum thickening.   [place], MD [facility] [address] [place], [place] [phone] (office)

## 2020-04-15 ENCOUNTER — Other Ambulatory Visit: Payer: Self-pay | Admitting: Family Medicine

## 2020-04-15 DIAGNOSIS — R1084 Generalized abdominal pain: Secondary | ICD-10-CM

## 2020-04-15 DIAGNOSIS — R197 Diarrhea, unspecified: Secondary | ICD-10-CM

## 2020-04-15 NOTE — Telephone Encounter (Signed)
Pt aware and labs ordered. Pt states that he will go in the next 2-3 days.

## 2020-04-19 LAB — CBC WITH DIFFERENTIAL/PLATELET
Absolute Monocytes: 1121 cells/uL — ABNORMAL HIGH (ref 200–950)
Basophils Absolute: 81 cells/uL (ref 0–200)
Basophils Relative: 0.6 %
Eosinophils Absolute: 135 cells/uL (ref 15–500)
Eosinophils Relative: 1 %
HCT: 47.7 % (ref 38.5–50.0)
Hemoglobin: 16.6 g/dL (ref 13.2–17.1)
Lymphs Abs: 1526 cells/uL (ref 850–3900)
MCH: 31.9 pg (ref 27.0–33.0)
MCHC: 34.8 g/dL (ref 32.0–36.0)
MCV: 91.6 fL (ref 80.0–100.0)
MPV: 9.4 fL (ref 7.5–12.5)
Monocytes Relative: 8.3 %
Neutro Abs: 10638 cells/uL — ABNORMAL HIGH (ref 1500–7800)
Neutrophils Relative %: 78.8 %
Platelets: 356 10*3/uL (ref 140–400)
RBC: 5.21 10*6/uL (ref 4.20–5.80)
RDW: 13 % (ref 11.0–15.0)
Total Lymphocyte: 11.3 %
WBC: 13.5 10*3/uL — ABNORMAL HIGH (ref 3.8–10.8)

## 2020-04-19 LAB — BASIC METABOLIC PANEL
BUN/Creatinine Ratio: 16 (calc) (ref 6–22)
BUN: 25 mg/dL (ref 7–25)
CO2: 26 mmol/L (ref 20–32)
Calcium: 9.3 mg/dL (ref 8.6–10.3)
Chloride: 97 mmol/L — ABNORMAL LOW (ref 98–110)
Creat: 1.55 mg/dL — ABNORMAL HIGH (ref 0.70–1.18)
Glucose, Bld: 112 mg/dL (ref 65–139)
Potassium: 4.2 mmol/L (ref 3.5–5.3)
Sodium: 133 mmol/L — ABNORMAL LOW (ref 135–146)

## 2020-04-22 LAB — C. DIFFICILE GDH AND TOXIN A/B
GDH ANTIGEN: NOT DETECTED
MICRO NUMBER:: 11646031
SPECIMEN QUALITY:: ADEQUATE
TOXIN A AND B: NOT DETECTED

## 2022-04-05 DIAGNOSIS — M545 Low back pain, unspecified: Secondary | ICD-10-CM | POA: Diagnosis not present

## 2022-04-05 DIAGNOSIS — M25551 Pain in right hip: Secondary | ICD-10-CM | POA: Diagnosis not present

## 2022-04-05 DIAGNOSIS — M47816 Spondylosis without myelopathy or radiculopathy, lumbar region: Secondary | ICD-10-CM | POA: Diagnosis not present

## 2022-04-05 DIAGNOSIS — M5136 Other intervertebral disc degeneration, lumbar region: Secondary | ICD-10-CM | POA: Diagnosis not present

## 2022-06-03 IMAGING — DX DG CHEST 1V PORT
1 series · 1 of 1 positions shown · non-contrast
Comparison: April 09, 2020

CLINICAL DATA: NG tube placement

EXAM:
PORTABLE CHEST 1 VIEW

[abdomen erect ap]
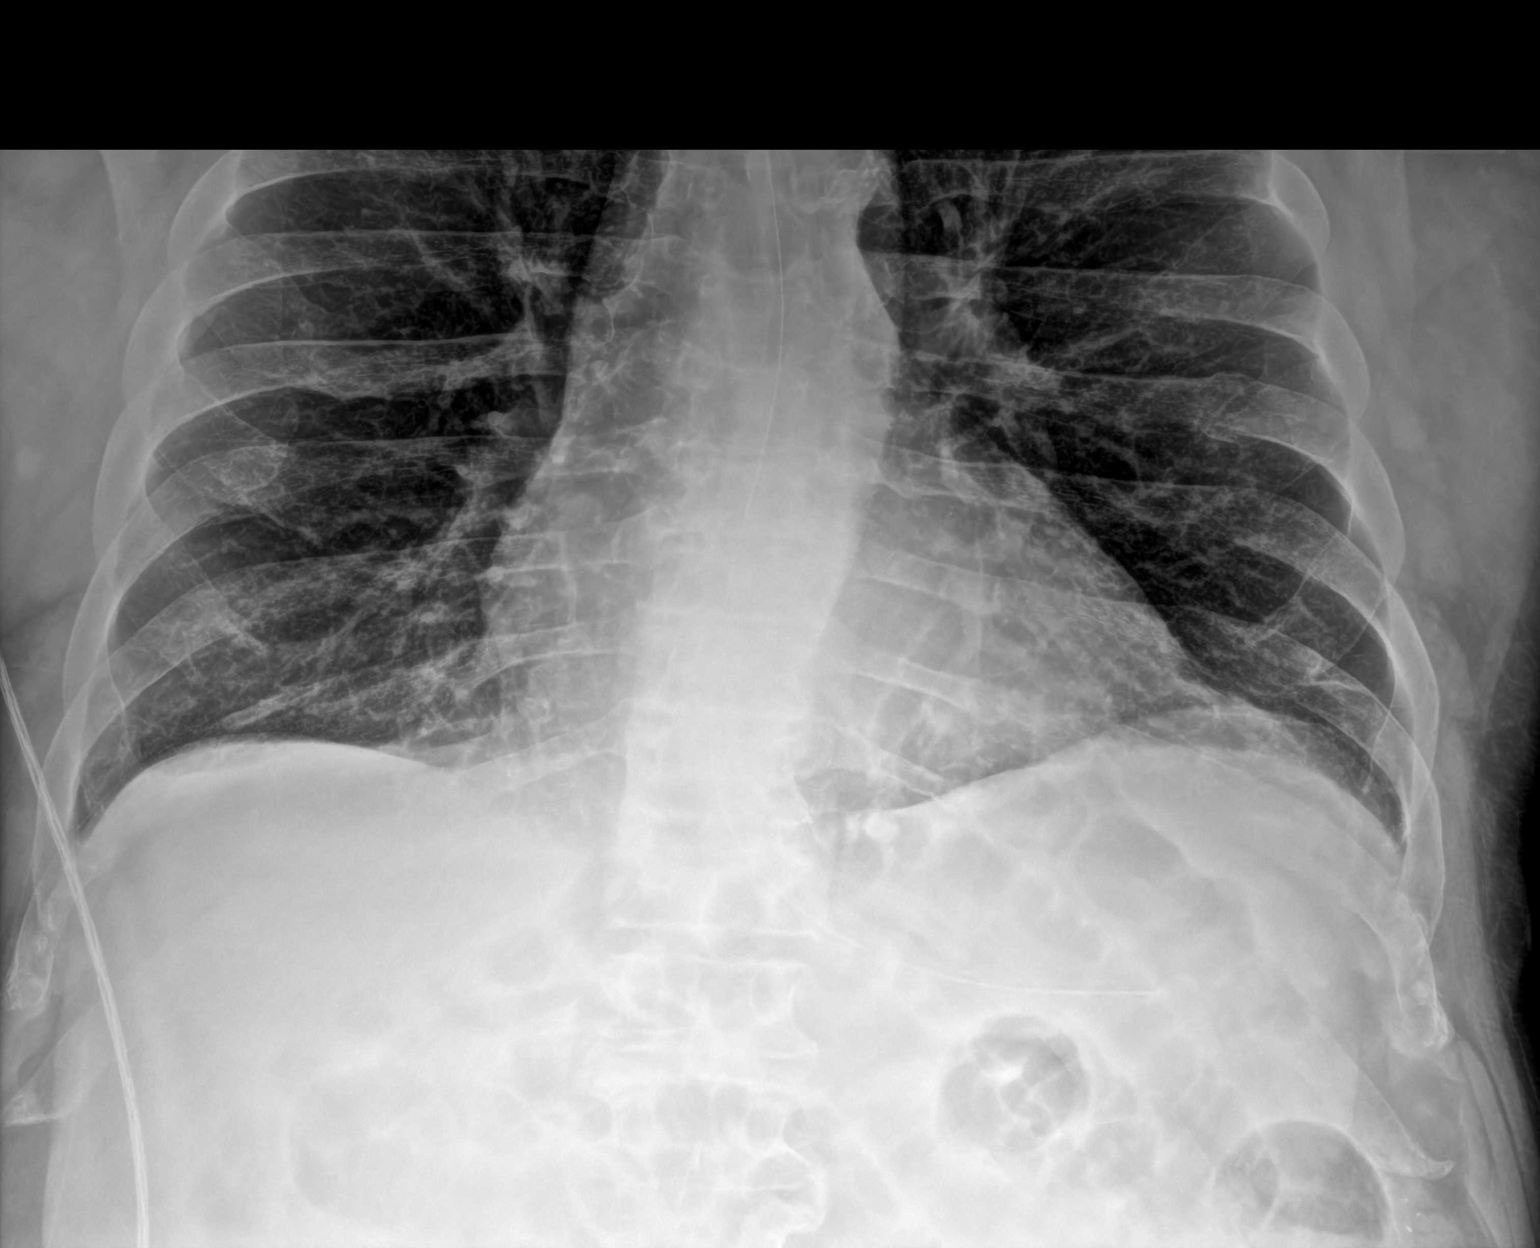

[1 of 1 positions shown; findings below may reference images not displayed]

FINDINGS: The enteric tube tip projects over the expected region of the
gastric body. Dilated loops of bowel are noted in the upper abdomen.
There are old healed left-sided rib fractures.
IMPRESSION: NG tube projects over the gastric body.

## 2022-06-03 IMAGING — DX DG CHEST 1V PORT
1 series · 1 of 1 positions shown · non-contrast
Comparison: None.

CLINICAL DATA: Hypoxia

EXAM:
PORTABLE CHEST 1 VIEW

[chest pa]
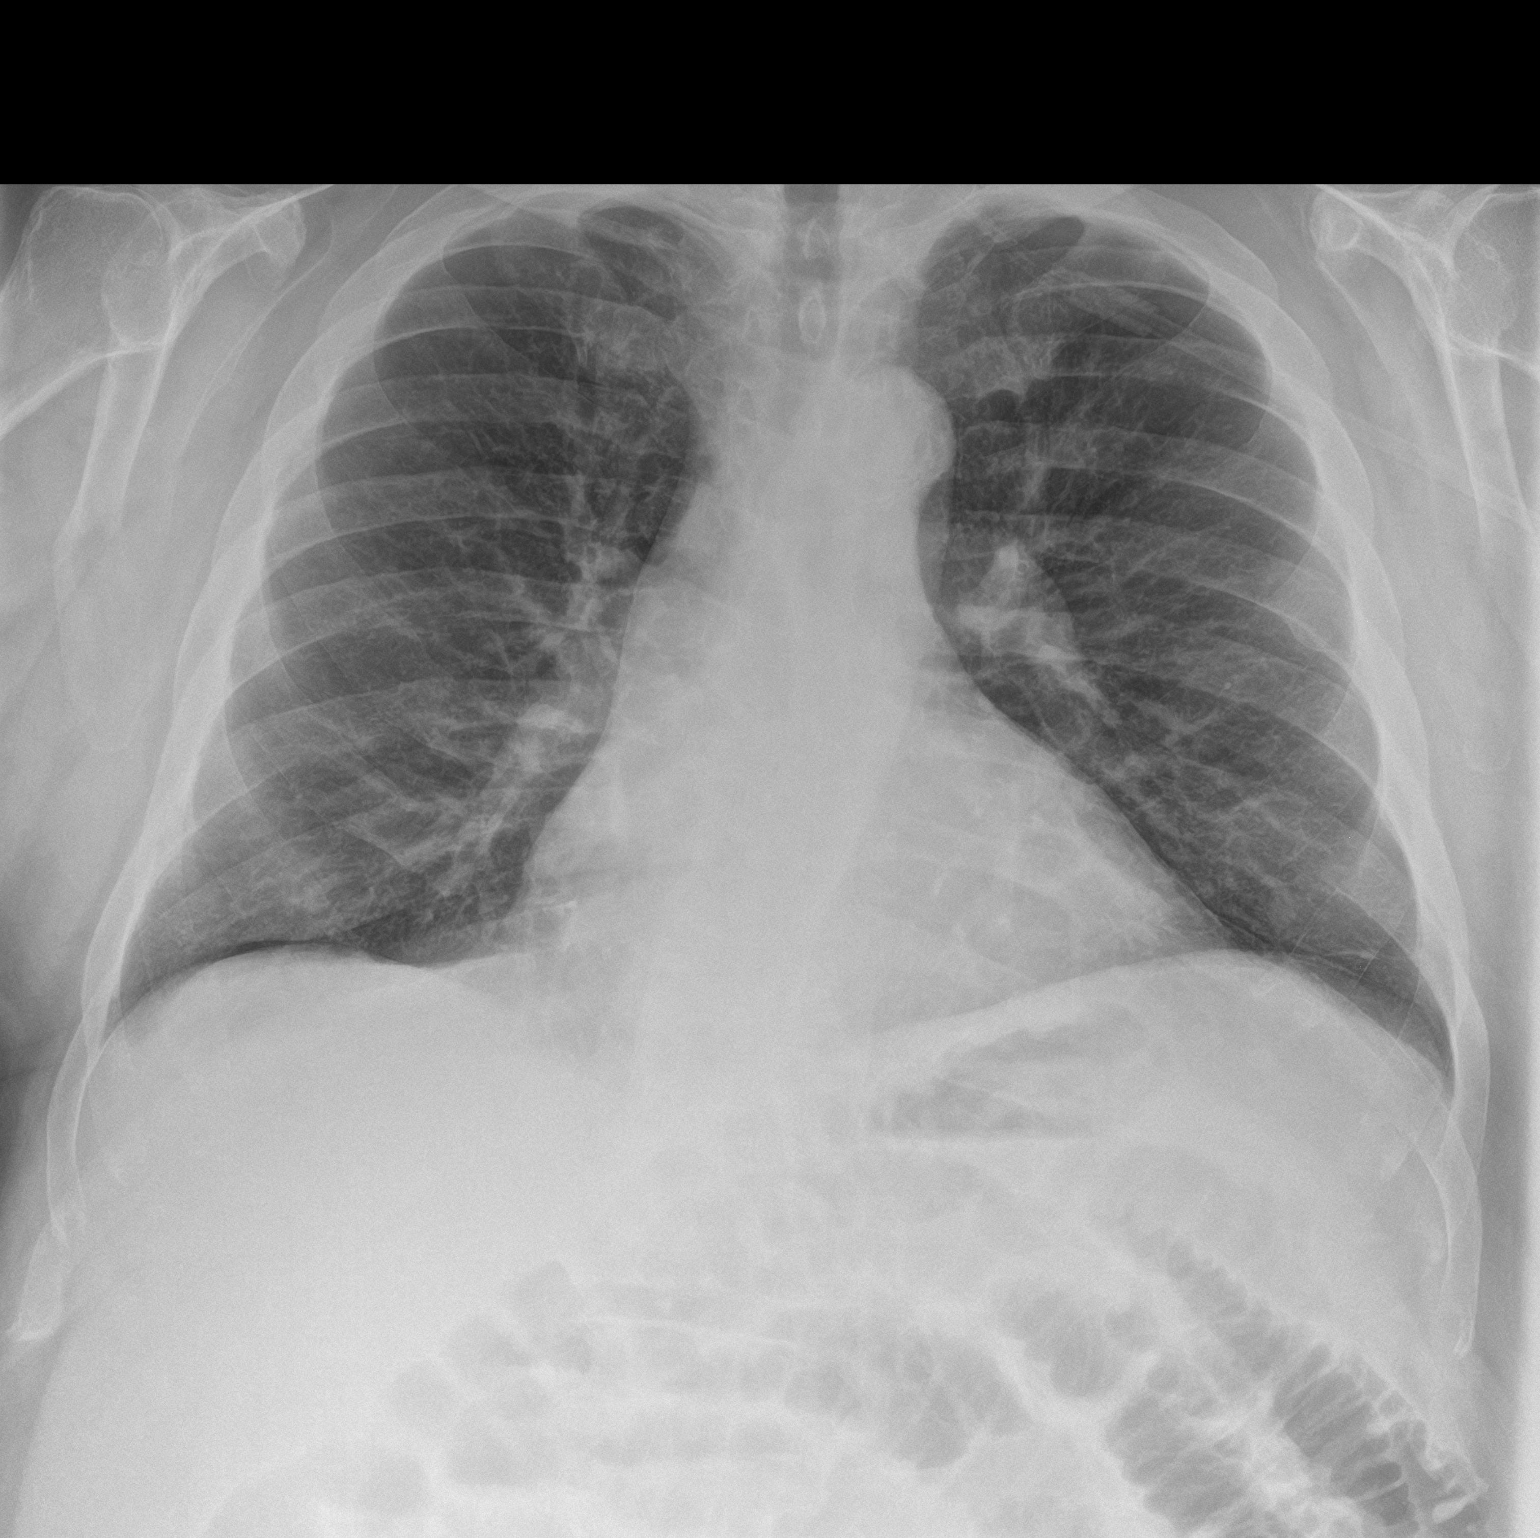

[1 of 1 positions shown; findings below may reference images not displayed]

FINDINGS: The heart size and mediastinal contours are within normal limits.
Aortic knob calcifications are seen. Both lungs are clear. The
visualized skeletal structures are unremarkable.
IMPRESSION: No active disease.

## 2022-06-03 IMAGING — DX DG ABDOMEN 1V
2 series · 2 of 2 positions shown · non-contrast
Comparison: CT abdomen 04/08/2020.

CLINICAL DATA: Nausea and vomiting

EXAM:
ABDOMEN - 1 VIEW

[abdomen supine (1 of 2)]
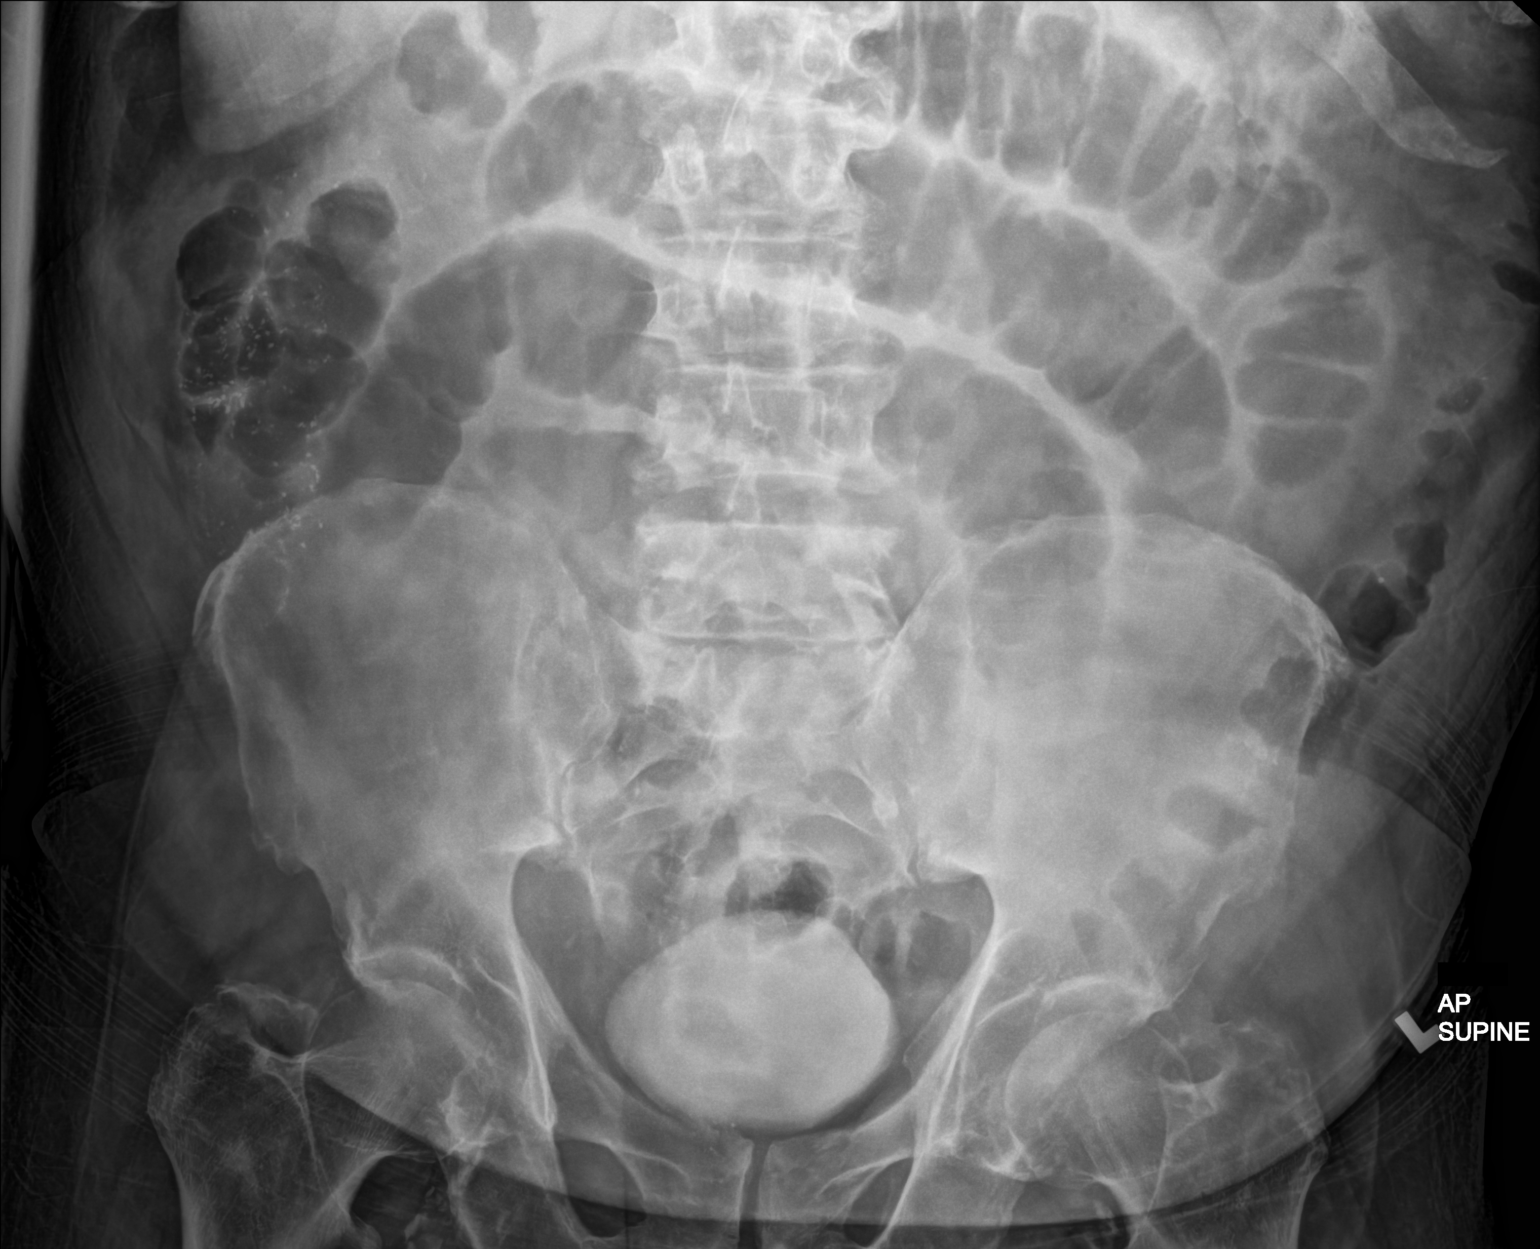

[abdomen supine (2 of 2)]
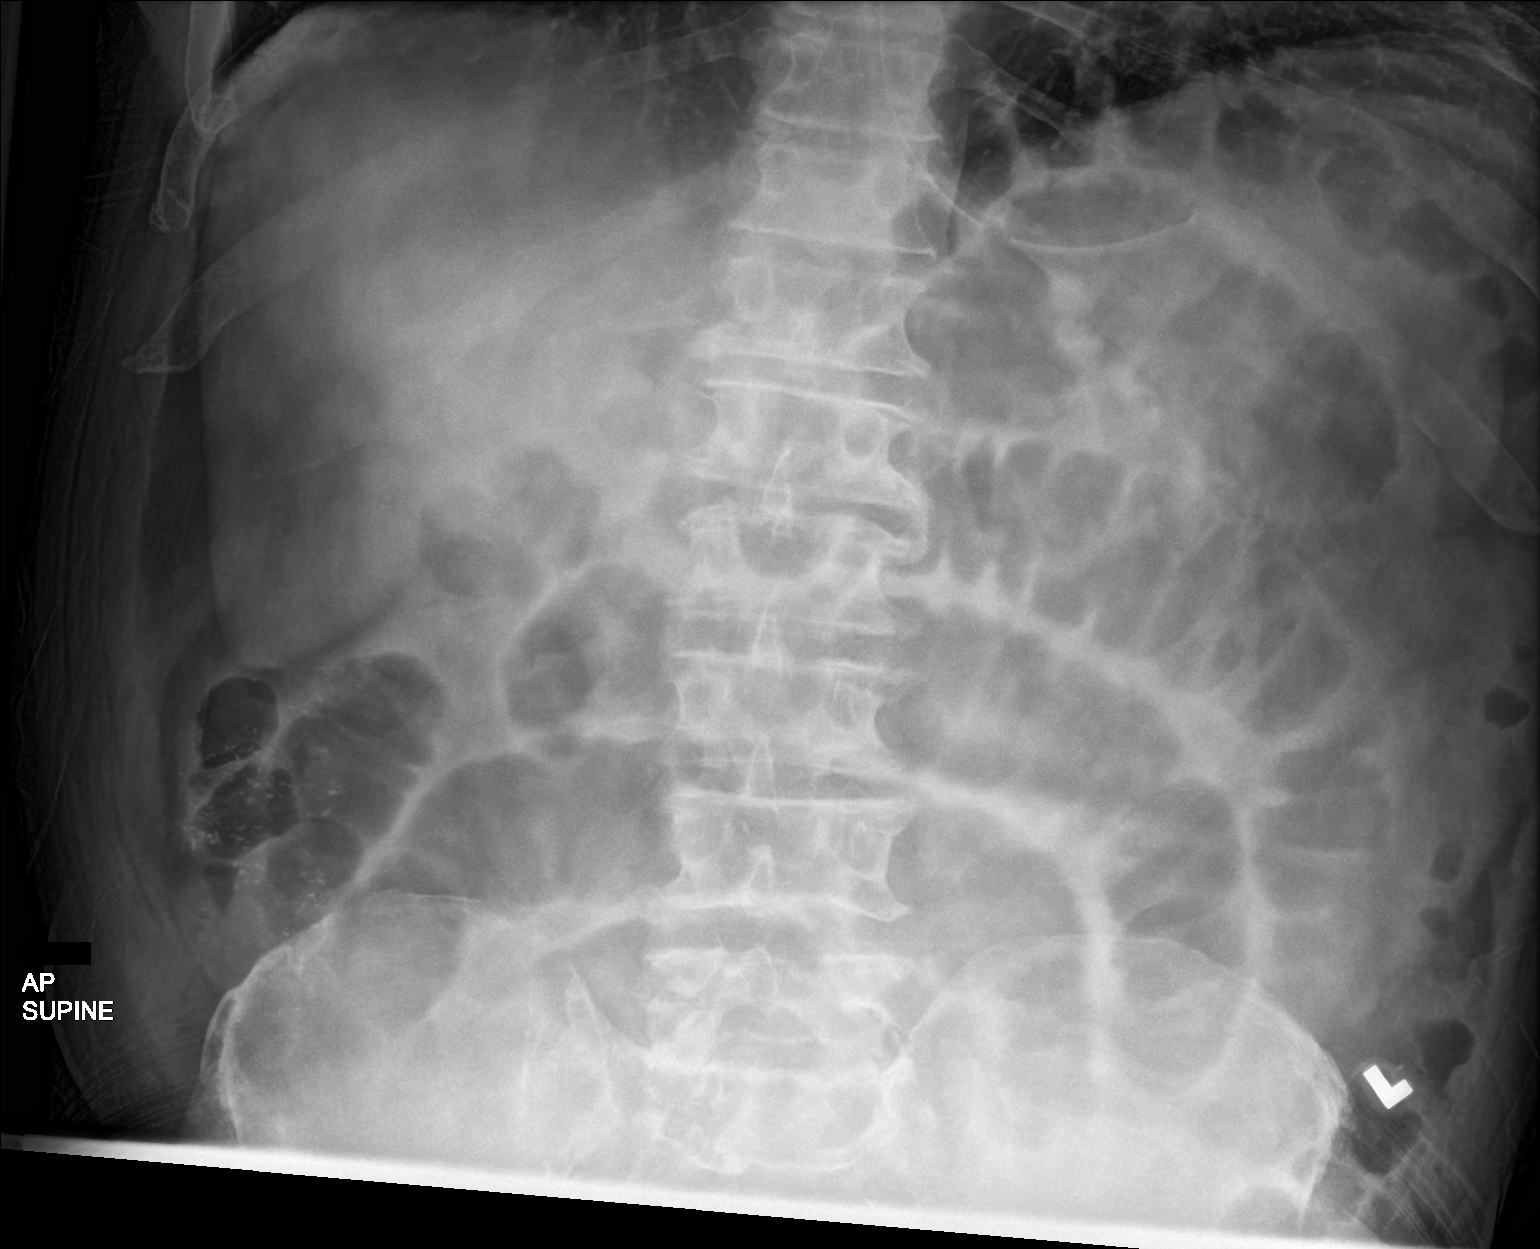

[2 of 2 positions shown; findings below may reference images not displayed]

FINDINGS: Moderate small bowel distension. Colon decompressed with small
amount of gas. Upright view not obtained. NG tube in the proximal
stomach. Contrast in the urinary bladder from CT
IMPRESSION: Diffuse small bowel dilatation compatible small bowel obstruction.

NG tube in the proximal stomach.

## 2022-08-11 DIAGNOSIS — M549 Dorsalgia, unspecified: Secondary | ICD-10-CM | POA: Diagnosis not present

## 2022-08-11 DIAGNOSIS — R030 Elevated blood-pressure reading, without diagnosis of hypertension: Secondary | ICD-10-CM | POA: Diagnosis not present

## 2022-08-11 DIAGNOSIS — M545 Low back pain, unspecified: Secondary | ICD-10-CM | POA: Diagnosis not present

## 2022-09-20 DIAGNOSIS — M1611 Unilateral primary osteoarthritis, right hip: Secondary | ICD-10-CM | POA: Diagnosis not present

## 2022-09-20 DIAGNOSIS — M48061 Spinal stenosis, lumbar region without neurogenic claudication: Secondary | ICD-10-CM | POA: Diagnosis not present

## 2022-09-27 DIAGNOSIS — M1611 Unilateral primary osteoarthritis, right hip: Secondary | ICD-10-CM | POA: Diagnosis not present

## 2022-10-18 DIAGNOSIS — M25551 Pain in right hip: Secondary | ICD-10-CM | POA: Diagnosis not present

## 2022-10-18 DIAGNOSIS — M48061 Spinal stenosis, lumbar region without neurogenic claudication: Secondary | ICD-10-CM | POA: Diagnosis not present

## 2022-11-03 DIAGNOSIS — M5416 Radiculopathy, lumbar region: Secondary | ICD-10-CM | POA: Diagnosis not present

## 2022-11-30 DIAGNOSIS — M25551 Pain in right hip: Secondary | ICD-10-CM | POA: Diagnosis not present

## 2022-11-30 DIAGNOSIS — L98499 Non-pressure chronic ulcer of skin of other sites with unspecified severity: Secondary | ICD-10-CM | POA: Diagnosis not present

## 2022-12-08 DIAGNOSIS — M25551 Pain in right hip: Secondary | ICD-10-CM | POA: Diagnosis not present

## 2023-05-01 DIAGNOSIS — J440 Chronic obstructive pulmonary disease with acute lower respiratory infection: Secondary | ICD-10-CM | POA: Diagnosis not present

## 2023-05-01 DIAGNOSIS — I10 Essential (primary) hypertension: Secondary | ICD-10-CM | POA: Diagnosis not present

## 2023-05-01 DIAGNOSIS — R051 Acute cough: Secondary | ICD-10-CM | POA: Diagnosis not present

## 2023-06-02 DIAGNOSIS — M25551 Pain in right hip: Secondary | ICD-10-CM | POA: Diagnosis not present

## 2023-07-04 DIAGNOSIS — G8929 Other chronic pain: Secondary | ICD-10-CM | POA: Diagnosis not present

## 2023-07-04 DIAGNOSIS — I10 Essential (primary) hypertension: Secondary | ICD-10-CM | POA: Diagnosis not present

## 2023-07-04 DIAGNOSIS — M1611 Unilateral primary osteoarthritis, right hip: Secondary | ICD-10-CM | POA: Diagnosis not present

## 2023-07-04 DIAGNOSIS — M1711 Unilateral primary osteoarthritis, right knee: Secondary | ICD-10-CM | POA: Diagnosis not present

## 2023-07-04 DIAGNOSIS — S8991XA Unspecified injury of right lower leg, initial encounter: Secondary | ICD-10-CM | POA: Diagnosis not present

## 2023-07-04 DIAGNOSIS — Z043 Encounter for examination and observation following other accident: Secondary | ICD-10-CM | POA: Diagnosis not present

## 2023-07-04 DIAGNOSIS — M25561 Pain in right knee: Secondary | ICD-10-CM | POA: Diagnosis not present

## 2023-07-04 DIAGNOSIS — Z87891 Personal history of nicotine dependence: Secondary | ICD-10-CM | POA: Diagnosis not present

## 2023-07-04 DIAGNOSIS — M25461 Effusion, right knee: Secondary | ICD-10-CM | POA: Diagnosis not present

## 2023-07-28 ENCOUNTER — Ambulatory Visit (INDEPENDENT_AMBULATORY_CARE_PROVIDER_SITE_OTHER): Admitting: Orthopaedic Surgery

## 2023-07-28 ENCOUNTER — Encounter: Payer: Self-pay | Admitting: Orthopaedic Surgery

## 2023-07-28 ENCOUNTER — Other Ambulatory Visit: Payer: Self-pay

## 2023-07-28 DIAGNOSIS — M1611 Unilateral primary osteoarthritis, right hip: Secondary | ICD-10-CM

## 2023-07-28 DIAGNOSIS — M25551 Pain in right hip: Secondary | ICD-10-CM

## 2023-07-28 NOTE — Progress Notes (Signed)
 Subjective:    Patient ID: Melvin [name], male    DOB: [birth year], 83 y.o.   MRN: [id number]  HPI He has long history of right hip pain.  He has been followed at the [facility] in [place].  He has tried medications, PT with little help.  He uses a walker.  He has decided to have a total hip. It has been recommended before but he declined.  [facility].  He would like to have surgery soon.  His general health is good.  He has more pain.  He has no trauma.  He has some right knee pain also.   Review of Systems  Constitutional:  Positive for activity change.  Respiratory:  Positive for shortness of breath.   Musculoskeletal:  Positive for arthralgias, gait problem and joint swelling.  All other systems reviewed and are negative. For Review of Systems, all other systems reviewed and are negative.  The following is a summary of the past history medically, past history surgically, known current medicines, social history and family history.  This information is gathered electronically by the computer from prior information and documentation.  I review this each visit and have found including this information at this point in the chart is beneficial and informative.   Past Medical History:  Diagnosis Date   COPD (chronic obstructive pulmonary disease) (HCC)    Hypertension     History reviewed. No pertinent surgical history.  Current Outpatient Medications on File Prior to Visit  Medication Sig Dispense Refill   albuterol  (VENTOLIN  HFA) 108 (90 Base) MCG/ACT inhaler Inhale 2 puffs into the lungs every 4 (four) hours as needed for wheezing or shortness of breath.     AMLODIPINE  BESYLATE PO Take 10 mg by mouth daily.     aspirin  EC 81 MG tablet Take 81 mg by mouth daily. Swallow whole.     budesonide-formoterol  (SYMBICORT) 160-4.5 MCG/ACT inhaler Inhale 2 puffs into the lungs 2 (two) times daily.     cetirizine (ZYRTEC) 10 MG tablet Take  10 mg by mouth daily.     Menthol-Methyl Salicylate (THERA-GESIC) 0.5-15 % CREA Apply 1 application topically in the morning and at bedtime. Avoid face,groin,wash hands after use     Multiple Vitamin (MULTIVITAMIN) tablet Take 1 tablet by mouth daily.     omeprazole (PRILOSEC) 20 MG capsule Take 20 mg by mouth daily.     pramipexole  (MIRAPEX ) 0.125 MG tablet Take 0.125 mg by mouth at bedtime.     simvastatin  (ZOCOR ) 40 MG tablet Take 40 mg by mouth daily.     tiotropium (SPIRIVA ) 18 MCG inhalation capsule Place 18 mcg into inhaler and inhale daily.     No current facility-administered medications on file prior to visit.    Social History   Socioeconomic History   Marital status: Married    Spouse name: Not on file   Number of children: Not on file   Years of education: Not on file   Highest education level: Not on file  Occupational History   Not on file  Tobacco Use   Smoking status: Former   Smokeless tobacco: Never  Vaping Use   Vaping status: Never Used  Substance and Sexual Activity   Alcohol use: Not on file   Drug use: Not on file   Sexual activity: Not on file  Other Topics Concern   Not on file  Social History Narrative   Not  on file   Social Drivers of Health   Financial Resource Strain: Not on file  Food Insecurity: Not on file  Transportation Needs: Not on file  Physical Activity: Not on file  Stress: Not on file  Social Connections: Not on file  Intimate Partner Violence: Not At Risk (07/04/2023)   Received from [facility]   HITS    Over the last 12 months how often did your partner physically hurt you?: Never    Over the last 12 months how often did your partner insult you or talk down to you?: Never    Over the last 12 months how often did your partner threaten you with physical harm?: Never    Over the last 12 months how often did your partner scream or curse at you?: Never    History reviewed. No pertinent family history.  There were no vitals  taken for this visit.  There is no height or weight on file to calculate BMI.      Objective:   Physical Exam Vitals and nursing note reviewed. Exam conducted with a chaperone present.  Constitutional:      Appearance: He is well-developed.  HENT:     Head: Normocephalic and atraumatic.   Eyes:     Conjunctiva/sclera: Conjunctivae normal.     Pupils: Pupils are equal, round, and reactive to light.    Cardiovascular:     Rate and Rhythm: Normal rate and regular rhythm.  Pulmonary:     Effort: Pulmonary effort is normal.  Abdominal:     Palpations: Abdomen is soft.   Musculoskeletal:     Cervical back: Normal range of motion and neck supple.       Legs:   Skin:    General: Skin is warm and dry.   Neurological:     Mental Status: He is alert and oriented to person, place, and time.     Cranial Nerves: No cranial nerve deficit.     Motor: No abnormal muscle tone.     Coordination: Coordination normal.     Deep Tendon Reflexes: Reflexes are normal and symmetric. Reflexes normal.   Psychiatric:        Behavior: Behavior normal.        Thought Content: Thought content normal.        Judgment: Judgment normal.   X-rays were done of the right hip showing severe degenerative changes, reported separately.  No fracture is present.         Assessment & Plan:   Encounter Diagnoses  Name Primary?   Pain in right hip Yes   Unilateral primary osteoarthritis, right hip    I will have him see by Dr. Lucienne Ryder in the [facility] office.  Patient is agreeable to this.  Call if any problem.  Precautions discussed.  Electronically Signed Pleasant Brilliant, MD 6/19/20259:58 AM

## 2023-08-17 ENCOUNTER — Encounter: Admitting: Orthopaedic Surgery

## 2023-10-10 DEATH — deceased
